# Patient Record
Sex: Female | Born: 1935 | Race: Black or African American | Hispanic: No | Smoking: Never smoker
Health system: Southern US, Community
[De-identification: ages and names within clinical notes are randomized; demographics above are authoritative.]

## PROBLEM LIST (undated history)

## (undated) DIAGNOSIS — C801 Malignant (primary) neoplasm, unspecified: Secondary | ICD-10-CM

## (undated) DIAGNOSIS — I10 Essential (primary) hypertension: Secondary | ICD-10-CM

## (undated) DIAGNOSIS — I2510 Atherosclerotic heart disease of native coronary artery without angina pectoris: Secondary | ICD-10-CM

## (undated) DIAGNOSIS — I219 Acute myocardial infarction, unspecified: Secondary | ICD-10-CM

## (undated) DIAGNOSIS — D649 Anemia, unspecified: Secondary | ICD-10-CM

## (undated) DIAGNOSIS — C50919 Malignant neoplasm of unspecified site of unspecified female breast: Secondary | ICD-10-CM

## (undated) HISTORY — PX: COLONOSCOPY: SHX174

---

## 1982-01-17 HISTORY — PX: FOOT SURGERY: SHX648

## 2003-10-28 ENCOUNTER — Ambulatory Visit: Payer: Self-pay | Admitting: Unknown Physician Specialty

## 2004-10-25 ENCOUNTER — Ambulatory Visit: Payer: Self-pay | Admitting: Internal Medicine

## 2005-10-27 ENCOUNTER — Ambulatory Visit: Payer: Self-pay | Admitting: Internal Medicine

## 2007-01-25 ENCOUNTER — Ambulatory Visit: Payer: Self-pay | Admitting: Family Medicine

## 2008-02-14 ENCOUNTER — Ambulatory Visit: Payer: Self-pay | Admitting: Family Medicine

## 2008-06-15 ENCOUNTER — Ambulatory Visit: Payer: Self-pay | Admitting: Internal Medicine

## 2008-12-18 ENCOUNTER — Ambulatory Visit: Payer: Self-pay | Admitting: Unknown Physician Specialty

## 2009-01-04 ENCOUNTER — Ambulatory Visit: Payer: Self-pay | Admitting: Internal Medicine

## 2009-01-17 DIAGNOSIS — C801 Malignant (primary) neoplasm, unspecified: Secondary | ICD-10-CM

## 2009-01-17 DIAGNOSIS — C50919 Malignant neoplasm of unspecified site of unspecified female breast: Secondary | ICD-10-CM

## 2009-01-17 HISTORY — PX: BREAST BIOPSY: SHX20

## 2009-01-17 HISTORY — PX: BREAST EXCISIONAL BIOPSY: SUR124

## 2009-01-17 HISTORY — DX: Malignant neoplasm of unspecified site of unspecified female breast: C50.919

## 2009-01-17 HISTORY — DX: Malignant (primary) neoplasm, unspecified: C80.1

## 2009-07-09 ENCOUNTER — Ambulatory Visit: Payer: Self-pay | Admitting: Unknown Physician Specialty

## 2009-07-21 ENCOUNTER — Ambulatory Visit: Payer: Self-pay | Admitting: Family Medicine

## 2009-09-14 ENCOUNTER — Ambulatory Visit: Payer: Self-pay | Admitting: Family Medicine

## 2009-09-17 ENCOUNTER — Ambulatory Visit: Payer: Self-pay | Admitting: Family Medicine

## 2009-10-02 ENCOUNTER — Ambulatory Visit: Payer: Self-pay | Admitting: Surgery

## 2009-10-08 ENCOUNTER — Ambulatory Visit: Payer: Self-pay | Admitting: Cardiovascular Disease

## 2009-10-12 ENCOUNTER — Ambulatory Visit: Payer: Self-pay | Admitting: Surgery

## 2009-10-17 ENCOUNTER — Ambulatory Visit: Payer: Self-pay | Admitting: Radiation Oncology

## 2009-10-21 ENCOUNTER — Ambulatory Visit: Payer: Self-pay | Admitting: Internal Medicine

## 2009-10-22 LAB — CANCER ANTIGEN 27.29: CA 27.29: 36.6 U/mL (ref 0.0–38.6)

## 2009-10-27 ENCOUNTER — Other Ambulatory Visit: Payer: Self-pay | Admitting: Unknown Physician Specialty

## 2009-11-12 ENCOUNTER — Ambulatory Visit: Payer: Self-pay | Admitting: Surgery

## 2009-11-17 ENCOUNTER — Ambulatory Visit: Payer: Self-pay | Admitting: Radiation Oncology

## 2009-11-23 ENCOUNTER — Ambulatory Visit: Payer: Self-pay | Admitting: Surgery

## 2009-11-27 ENCOUNTER — Ambulatory Visit: Payer: Self-pay | Admitting: Radiation Oncology

## 2009-12-08 ENCOUNTER — Ambulatory Visit: Payer: Self-pay | Admitting: Urology

## 2009-12-17 ENCOUNTER — Ambulatory Visit: Payer: Self-pay | Admitting: Radiation Oncology

## 2009-12-17 ENCOUNTER — Ambulatory Visit: Payer: Self-pay | Admitting: Internal Medicine

## 2010-01-17 ENCOUNTER — Ambulatory Visit: Payer: Self-pay | Admitting: Internal Medicine

## 2010-01-17 ENCOUNTER — Ambulatory Visit: Payer: Self-pay | Admitting: Radiation Oncology

## 2010-02-17 ENCOUNTER — Ambulatory Visit: Payer: Self-pay | Admitting: Internal Medicine

## 2010-02-17 ENCOUNTER — Ambulatory Visit: Payer: Self-pay | Admitting: Radiation Oncology

## 2010-03-18 ENCOUNTER — Ambulatory Visit: Payer: Self-pay | Admitting: Internal Medicine

## 2010-03-18 ENCOUNTER — Ambulatory Visit: Payer: Self-pay | Admitting: Radiation Oncology

## 2010-04-02 LAB — CANCER ANTIGEN 27.29: CA 27.29: 34.7 U/mL (ref 0.0–38.6)

## 2010-04-18 ENCOUNTER — Ambulatory Visit: Payer: Self-pay | Admitting: Internal Medicine

## 2010-04-18 ENCOUNTER — Ambulatory Visit: Payer: Self-pay | Admitting: Radiation Oncology

## 2010-10-04 ENCOUNTER — Ambulatory Visit: Payer: Self-pay | Admitting: Internal Medicine

## 2010-11-23 ENCOUNTER — Ambulatory Visit: Payer: Self-pay | Admitting: Urology

## 2011-05-10 ENCOUNTER — Ambulatory Visit: Payer: Self-pay | Admitting: Oncology

## 2011-05-31 ENCOUNTER — Ambulatory Visit: Payer: Self-pay | Admitting: Oncology

## 2011-05-31 LAB — COMPREHENSIVE METABOLIC PANEL WITH GFR
Albumin: 3.2 g/dL — ABNORMAL LOW (ref 3.4–5.0)
Alkaline Phosphatase: 81 U/L (ref 50–136)
Anion Gap: 7 (ref 7–16)
BUN: 14 mg/dL (ref 7–18)
Bilirubin,Total: 0.3 mg/dL (ref 0.2–1.0)
Calcium, Total: 8.7 mg/dL (ref 8.5–10.1)
Chloride: 103 mmol/L (ref 98–107)
Co2: 30 mmol/L (ref 21–32)
Creatinine: 0.92 mg/dL (ref 0.60–1.30)
EGFR (African American): >60
EGFR (Non-African Amer.): >60
Glucose: 92 mg/dL (ref 65–99)
Osmolality: 280 (ref 275–301)
Potassium: 3.8 mmol/L (ref 3.5–5.1)
SGOT(AST): 16 U/L (ref 15–37)
SGPT (ALT): 20 U/L
Sodium: 140 mmol/L (ref 136–145)
Total Protein: 8 g/dL (ref 6.4–8.2)

## 2011-05-31 LAB — CBC CANCER CENTER
Eosinophil #: 0.2 x10 3/mm (ref 0.0–0.7)
Lymphocyte %: 21 %
MCHC: 32.9 g/dL (ref 32.0–36.0)
Neutrophil %: 66.6 %
Platelet: 173 x10 3/mm (ref 150–440)
RBC: 4.24 10*6/uL (ref 3.80–5.20)

## 2011-06-18 ENCOUNTER — Ambulatory Visit: Payer: Self-pay | Admitting: Oncology

## 2011-07-18 ENCOUNTER — Ambulatory Visit: Payer: Self-pay | Admitting: Oncology

## 2011-12-08 ENCOUNTER — Ambulatory Visit: Payer: Self-pay | Admitting: Oncology

## 2011-12-18 ENCOUNTER — Ambulatory Visit: Payer: Self-pay

## 2011-12-18 LAB — COMPREHENSIVE METABOLIC PANEL
Albumin: 3.3 g/dL — ABNORMAL LOW (ref 3.4–5.0)
Anion Gap: 12 (ref 7–16)
BUN: 16 mg/dL (ref 7–18)
Bilirubin,Total: 0.6 mg/dL (ref 0.2–1.0)
Chloride: 102 mmol/L (ref 98–107)
Creatinine: 1.12 mg/dL (ref 0.60–1.30)
EGFR (African American): 55 — ABNORMAL LOW
Osmolality: 283 (ref 275–301)
Potassium: 3.1 mmol/L — ABNORMAL LOW (ref 3.5–5.1)
SGPT (ALT): 34 U/L (ref 12–78)
Sodium: 141 mmol/L (ref 136–145)
Total Protein: 8.1 g/dL (ref 6.4–8.2)

## 2011-12-18 LAB — URINALYSIS, COMPLETE
Bilirubin,UR: NEGATIVE
Glucose,UR: NEGATIVE mg/dL (ref 0–75)
Leukocyte Esterase: NEGATIVE
Ph: 6 (ref 4.5–8.0)
Protein: 100

## 2011-12-18 LAB — CBC WITH DIFFERENTIAL/PLATELET
Basophil %: 0.5 %
HCT: 38.2 % (ref 35.0–47.0)
HGB: 12.5 g/dL (ref 12.0–16.0)
Lymphocyte %: 14.3 %
Monocyte #: 0.6 x10 3/mm (ref 0.2–0.9)
Monocyte %: 8.1 %
Neutrophil #: 5.8 10*3/uL (ref 1.4–6.5)
WBC: 7.6 10*3/uL (ref 3.6–11.0)

## 2012-01-18 DIAGNOSIS — I219 Acute myocardial infarction, unspecified: Secondary | ICD-10-CM

## 2012-01-18 HISTORY — PX: CORONARY ANGIOPLASTY: SHX604

## 2012-01-18 HISTORY — DX: Acute myocardial infarction, unspecified: I21.9

## 2012-07-18 ENCOUNTER — Ambulatory Visit: Payer: Self-pay | Admitting: Oncology

## 2012-07-24 LAB — CBC CANCER CENTER
Basophil %: 0.9 %
Eosinophil #: 0.2 x10 3/mm (ref 0.0–0.7)
Eosinophil %: 3.8 %
HCT: 34.2 % — ABNORMAL LOW (ref 35.0–47.0)
Lymphocyte #: 1.2 x10 3/mm (ref 1.0–3.6)
Lymphocyte %: 23.6 %
MCH: 28.4 pg (ref 26.0–34.0)
MCV: 84 fL (ref 80–100)
Monocyte %: 12.2 %
Platelet: 159 x10 3/mm (ref 150–440)
RDW: 15.7 % — ABNORMAL HIGH (ref 11.5–14.5)

## 2012-07-24 LAB — COMPREHENSIVE METABOLIC PANEL
Albumin: 3.1 g/dL — ABNORMAL LOW (ref 3.4–5.0)
Alkaline Phosphatase: 65 U/L (ref 50–136)
Anion Gap: 5 — ABNORMAL LOW (ref 7–16)
BUN: 18 mg/dL (ref 7–18)
Bilirubin,Total: 0.4 mg/dL (ref 0.2–1.0)
Co2: 31 mmol/L (ref 21–32)
Creatinine: 0.98 mg/dL (ref 0.60–1.30)
EGFR (African American): >60
Osmolality: 285 (ref 275–301)
Potassium: 3.7 mmol/L (ref 3.5–5.1)
SGOT(AST): 15 U/L (ref 15–37)
SGPT (ALT): 18 U/L (ref 12–78)
Total Protein: 7.1 g/dL (ref 6.4–8.2)

## 2012-08-17 ENCOUNTER — Ambulatory Visit: Payer: Self-pay | Admitting: Oncology

## 2012-08-24 DIAGNOSIS — E785 Hyperlipidemia, unspecified: Secondary | ICD-10-CM | POA: Insufficient documentation

## 2012-08-24 DIAGNOSIS — I10 Essential (primary) hypertension: Secondary | ICD-10-CM | POA: Insufficient documentation

## 2012-08-30 DIAGNOSIS — I2510 Atherosclerotic heart disease of native coronary artery without angina pectoris: Secondary | ICD-10-CM | POA: Insufficient documentation

## 2012-09-19 ENCOUNTER — Ambulatory Visit: Payer: Self-pay | Admitting: Internal Medicine

## 2012-09-19 LAB — URIC ACID: Uric Acid: 6.3 mg/dL — ABNORMAL HIGH (ref 2.6–6.0)

## 2012-09-27 ENCOUNTER — Encounter: Payer: Self-pay | Admitting: Internal Medicine

## 2012-10-17 ENCOUNTER — Encounter: Payer: Self-pay | Admitting: Internal Medicine

## 2012-11-17 ENCOUNTER — Encounter: Payer: Self-pay | Admitting: Internal Medicine

## 2012-12-11 ENCOUNTER — Ambulatory Visit: Payer: Self-pay | Admitting: Oncology

## 2012-12-17 ENCOUNTER — Encounter: Payer: Self-pay | Admitting: Internal Medicine

## 2013-05-24 DIAGNOSIS — R21 Rash and other nonspecific skin eruption: Secondary | ICD-10-CM | POA: Insufficient documentation

## 2013-08-14 ENCOUNTER — Ambulatory Visit: Payer: Self-pay | Admitting: Oncology

## 2013-08-16 LAB — COMPREHENSIVE METABOLIC PANEL
ALBUMIN: 3 g/dL — AB (ref 3.4–5.0)
ALK PHOS: 60 U/L
ALT: 13 U/L — AB
ANION GAP: 7 (ref 7–16)
BILIRUBIN TOTAL: 0.3 mg/dL (ref 0.2–1.0)
BUN: 17 mg/dL (ref 7–18)
CALCIUM: 9.2 mg/dL (ref 8.5–10.1)
CHLORIDE: 104 mmol/L (ref 98–107)
CO2: 29 mmol/L (ref 21–32)
Creatinine: 1.02 mg/dL (ref 0.60–1.30)
GFR CALC NON AF AMER: 53 — AB
GLUCOSE: 100 mg/dL — AB (ref 65–99)
OSMOLALITY: 281 (ref 275–301)
POTASSIUM: 3.8 mmol/L (ref 3.5–5.1)
SGOT(AST): 14 U/L — ABNORMAL LOW (ref 15–37)
SODIUM: 140 mmol/L (ref 136–145)
Total Protein: 7.3 g/dL (ref 6.4–8.2)

## 2013-08-16 LAB — CBC CANCER CENTER
BASOS ABS: 0.1 x10 3/mm (ref 0.0–0.1)
Basophil %: 1.4 %
EOS ABS: 0.3 x10 3/mm (ref 0.0–0.7)
EOS PCT: 4.4 %
HCT: 35.3 % (ref 35.0–47.0)
HGB: 11.3 g/dL — AB (ref 12.0–16.0)
LYMPHS PCT: 14 %
Lymphocyte #: 0.9 x10 3/mm — ABNORMAL LOW (ref 1.0–3.6)
MCH: 26.1 pg (ref 26.0–34.0)
MCHC: 31.9 g/dL — AB (ref 32.0–36.0)
MCV: 82 fL (ref 80–100)
MONO ABS: 0.6 x10 3/mm (ref 0.2–0.9)
Monocyte %: 8.7 %
Neutrophil #: 4.8 x10 3/mm (ref 1.4–6.5)
Neutrophil %: 71.5 %
Platelet: 192 x10 3/mm (ref 150–440)
RBC: 4.32 10*6/uL (ref 3.80–5.20)
RDW: 15.6 % — AB (ref 11.5–14.5)
WBC: 6.7 x10 3/mm (ref 3.6–11.0)

## 2013-08-17 ENCOUNTER — Ambulatory Visit: Payer: Self-pay | Admitting: Oncology

## 2013-08-19 LAB — CANCER ANTIGEN 27.29: CA 27.29: 29.1 U/mL (ref 0.0–38.6)

## 2013-09-20 DIAGNOSIS — M109 Gout, unspecified: Secondary | ICD-10-CM | POA: Insufficient documentation

## 2013-12-16 ENCOUNTER — Ambulatory Visit: Payer: Self-pay | Admitting: Oncology

## 2014-08-13 ENCOUNTER — Telehealth: Payer: Self-pay | Admitting: *Deleted

## 2014-08-13 MED ORDER — TAMOXIFEN CITRATE 20 MG PO TABS: 20.0000 mg | ORAL_TABLET | Freq: Every day | ORAL | Status: DC

## 2014-08-13 NOTE — Telephone Encounter (Signed)
Escribed

## 2014-08-15 ENCOUNTER — Other Ambulatory Visit: Payer: Self-pay | Admitting: *Deleted

## 2014-08-15 DIAGNOSIS — C50919 Malignant neoplasm of unspecified site of unspecified female breast: Secondary | ICD-10-CM

## 2014-08-20 ENCOUNTER — Inpatient Hospital Stay: Payer: Medicare HMO | Attending: Oncology

## 2014-08-20 ENCOUNTER — Inpatient Hospital Stay: Payer: Medicare HMO | Admitting: Oncology

## 2014-08-20 DIAGNOSIS — Z853 Personal history of malignant neoplasm of breast: Secondary | ICD-10-CM | POA: Insufficient documentation

## 2014-08-20 DIAGNOSIS — Z79899 Other long term (current) drug therapy: Secondary | ICD-10-CM | POA: Insufficient documentation

## 2014-08-20 DIAGNOSIS — Z170 Estrogen receptor positive status [ER+]: Secondary | ICD-10-CM | POA: Insufficient documentation

## 2014-09-16 ENCOUNTER — Encounter: Payer: Self-pay | Admitting: Oncology

## 2014-09-16 ENCOUNTER — Inpatient Hospital Stay (HOSPITAL_BASED_OUTPATIENT_CLINIC_OR_DEPARTMENT_OTHER): Payer: Medicare HMO | Admitting: Oncology

## 2014-09-16 ENCOUNTER — Inpatient Hospital Stay: Payer: Medicare HMO

## 2014-09-16 VITALS — BP 127/80 | HR 72 | Temp 96.1°F | Wt 145.0 lb

## 2014-09-16 DIAGNOSIS — Z170 Estrogen receptor positive status [ER+]: Secondary | ICD-10-CM | POA: Diagnosis not present

## 2014-09-16 DIAGNOSIS — Z853 Personal history of malignant neoplasm of breast: Secondary | ICD-10-CM

## 2014-09-16 DIAGNOSIS — Z79899 Other long term (current) drug therapy: Secondary | ICD-10-CM

## 2014-09-16 DIAGNOSIS — C50919 Malignant neoplasm of unspecified site of unspecified female breast: Secondary | ICD-10-CM

## 2014-09-16 LAB — CBC WITH DIFFERENTIAL/PLATELET
BASOS ABS: 0.1 10*3/uL (ref 0–0.1)
Basophils Relative: 1 %
EOS ABS: 0.4 10*3/uL (ref 0–0.7)
EOS PCT: 6 %
HCT: 35.7 % (ref 35.0–47.0)
Hemoglobin: 11.4 g/dL — ABNORMAL LOW (ref 12.0–16.0)
Lymphocytes Relative: 15 %
Lymphs Abs: 1.1 10*3/uL (ref 1.0–3.6)
MCH: 24.9 pg — AB (ref 26.0–34.0)
MCHC: 32.1 g/dL (ref 32.0–36.0)
MCV: 77.7 fL — ABNORMAL LOW (ref 80.0–100.0)
MONO ABS: 0.6 10*3/uL (ref 0.2–0.9)
Monocytes Relative: 9 %
Neutro Abs: 4.8 10*3/uL (ref 1.4–6.5)
Neutrophils Relative %: 69 %
PLATELETS: 165 10*3/uL (ref 150–440)
RBC: 4.59 MIL/uL (ref 3.80–5.20)
RDW: 19.2 % — AB (ref 11.5–14.5)
WBC: 7 10*3/uL (ref 3.6–11.0)

## 2014-09-16 LAB — COMPREHENSIVE METABOLIC PANEL
ALBUMIN: 3.5 g/dL (ref 3.5–5.0)
ALK PHOS: 58 U/L (ref 38–126)
ALT: 9 U/L — AB (ref 14–54)
ANION GAP: 5 (ref 5–15)
AST: 17 U/L (ref 15–41)
BUN: 16 mg/dL (ref 6–20)
CALCIUM: 8.3 mg/dL — AB (ref 8.9–10.3)
CO2: 27 mmol/L (ref 22–32)
CREATININE: 0.94 mg/dL (ref 0.44–1.00)
Chloride: 105 mmol/L (ref 101–111)
GFR calc Af Amer: >60 mL/min (ref >60)
GFR calc non Af Amer: 56 mL/min — ABNORMAL LOW (ref >60)
GLUCOSE: 126 mg/dL — AB (ref 65–99)
Potassium: 3.3 mmol/L — ABNORMAL LOW (ref 3.5–5.1)
SODIUM: 137 mmol/L (ref 135–145)
Total Bilirubin: 0.3 mg/dL (ref 0.3–1.2)
Total Protein: 7.4 g/dL (ref 6.5–8.1)

## 2014-09-16 NOTE — Progress Notes (Signed)
Patient does have living will.  Never smoked.  Here today for f/u regarding breast cancer. Patient states she had an MI a year ago.  Doing well since then except for exertional SOB

## 2014-09-16 NOTE — Progress Notes (Signed)
[place] @ [facility] Telephone:(336) [phone]  Fax:(336) [place]: [birth year]  MR#: [id number]  GMW#:[id number]  Patient Care Team: Mendel Ryder, MD as PCP - General (Family Medicine)  CHIEF COMPLAINT:  Chief Complaint  Patient presents with  . Follow-up   1. 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 09/17/09 bilateral mammogram: In the anterior depth, immediately lateral to the level of the nipple, there is a cluster of micocalcifications. Many calcifications are round, there are a few that are pleomorphic. These are new from prior study 02/14/08. b. 10/12/09: left breast needly localization breast biopsy: Pathology showing ductal carcinoma in situ, nuclear grade 2, solid and cribiform types with micocalcifications. DCISis <0.1 cm from inked margin. Focus of atypical lobular hyperplasia, Proliferative fibrocystic change.Negative for invasive ca. 2.  Patient has finished total 5 years of tamoxifen therapy and was advised to stop tamoxifen at present time (August of 2016)   INTERVAL HISTORY:  81 year old lady came today further follow-up regarding carcinoma of breast.  Patient denies any vaginal bleeding.  No abdominal discomfort.  Patient has finished 5 years of tamoxifen was advised to stop it.  No chills fever getting regular mammogram done.  REVIEW OF SYSTEMS:    general status: Patient is feeling weak and tired.  No change in a performance status.  No chills.  No fever. HEENT:   No evidence of stomatitis Lungs: No cough or shortness of breath Cardiac: No chest pain or paroxysmal nocturnal dyspnea GI: No nausea no vomiting no diarrhea no abdominal pain Skin: No rash Lower extremity no swelling Neurological system: No tingling.  No numbness.  No other focal signs Musculoskeletal system no bony pains  As per HPI. Otherwise, a complete review of systems is negatve.  Additional Past Medical and Surgical History: past medical history:    Hypertension  hyperlipidemia  HG SIL in 2000  Gout  Colonic poly[  GERD  Seasonal allergies    Past surgical history:  Ganglion cyst excision    Social history:  denies tobacco, ETOH  Married    Family history:  Mother died of lymphoma ADVANCED DIRECTIVES:  No flowsheet data found.  HEALTH MAINTENANCE: Social History  Substance Use Topics  . Smoking status: Never Smoker   . Smokeless tobacco: Not on file  . Alcohol Use: Not on file      Allergies  Allergen Reactions  . Lisinopril Cough  . Oxycodone Itching    Increase HR per patient  . Losartan Rash    Current Outpatient Prescriptions  Medication Sig Dispense Refill  . albuterol (PROAIR HFA) 108 (90 BASE) MCG/ACT inhaler Inhale into the lungs.    Marland Kitchen amLODipine (NORVASC) 10 MG tablet Take by mouth.    Marland Kitchen aspirin EC 81 MG tablet Take by mouth.    . metoprolol tartrate (LOPRESSOR) 25 MG tablet Take by mouth.    . Naphazoline-Pheniramine 0.027-0.315 % SOLN     . pravastatin (PRAVACHOL) 80 MG tablet Take by mouth.    . tamoxifen (NOLVADEX) 20 MG tablet Take 1 tablet (20 mg total) by mouth daily. 30 tablet 4   No current facility-administered medications for this visit.    OBJECTIVE:  Filed Vitals:   09/16/14 1612  BP: 127/80  Pulse: 72  Temp: 96.1 F (35.6 C)     There is no height on file to calculate BMI.    ECOG FS:1 - Symptomatic but completely ambulatory  PHYSICAL EXAM: GENERAL:  Well developed, well nourished, sitting comfortably in the exam room in no acute distress. MENTAL STATUS:  Alert and oriented to person, place and time.  ENT:  Oropharynx clear without lesion.  Tongue normal. Mucous membranes moist.  RESPIRATORY:  Clear to auscultation without rales, wheezes or rhonchi. CARDIOVASCULAR:  Regular rate and rhythm without murmur, rub or gallop. BREAST:  Right breast without masses, skin changes or nipple discharge.  Left breast without masses, skin changes or nipple discharge. ABDOMEN:  Soft,  non-tender, with active bowel sounds, and no hepatosplenomegaly.  No masses. BACK:  No CVA tenderness.  No tenderness on percussion of the back or rib cage. SKIN:  No rashes, ulcers or lesions. EXTREMITIES: No edema, no skin discoloration or tenderness.  No palpable cords. LYMPH NODES: No palpable cervical, supraclavicular, axillary or inguinal adenopathy  NEUROLOGICAL: Unremarkable. PSYCH:  Appropriate.  LAB RESULTS:  CBC Latest Ref Rng 09/16/2014 08/16/2013  WBC 3.6 - 11.0 K/uL 7.0 6.7  Hemoglobin 12.0 - 16.0 g/dL 11.4(L) 11.3(L)  Hematocrit 35.0 - 47.0 % 35.7 35.3  Platelets 150 - 440 K/uL 165 192    Appointment on 09/16/2014  Component Date Value Ref Range Status  . WBC 09/16/2014 7.0  3.6 - 11.0 K/uL Final  . RBC 09/16/2014 4.59  3.80 - 5.20 MIL/uL Final  . Hemoglobin 09/16/2014 11.4* 12.0 - 16.0 g/dL Final  . HCT 09/16/2014 35.7  35.0 - 47.0 % Final  . MCV 09/16/2014 77.7* 80.0 - 100.0 fL Final  . MCH 09/16/2014 24.9* 26.0 - 34.0 pg Final  . MCHC 09/16/2014 32.1  32.0 - 36.0 g/dL Final  . RDW 09/16/2014 19.2* 11.5 - 14.5 % Final  . Platelets 09/16/2014 165  150 - 440 K/uL Final  . Neutrophils Relative % 09/16/2014 69   Final  . Neutro Abs 09/16/2014 4.8  1.4 - 6.5 K/uL Final  . Lymphocytes Relative 09/16/2014 15   Final  . Lymphs Abs 09/16/2014 1.1  1.0 - 3.6 K/uL Final  . Monocytes Relative 09/16/2014 9   Final  . Monocytes Absolute 09/16/2014 0.6  0.2 - 0.9 K/uL Final  . Eosinophils Relative 09/16/2014 6   Final  . Eosinophils Absolute 09/16/2014 0.4  0 - 0.7 K/uL Final  . Basophils Relative 09/16/2014 1   Final  . Basophils Absolute 09/16/2014 0.1  0 - 0.1 K/uL Final  . Sodium 09/16/2014 137  135 - 145 mmol/L Final  . Potassium 09/16/2014 3.3* 3.5 - 5.1 mmol/L Final  . Chloride 09/16/2014 105  101 - 111 mmol/L Final  . CO2 09/16/2014 27  22 - 32 mmol/L Final  . Glucose, Bld 09/16/2014 126* 65 - 99 mg/dL Final  . BUN 09/16/2014 16  6 - 20 mg/dL Final  . Creatinine,  Ser 09/16/2014 0.94  0.44 - 1.00 mg/dL Final  . Calcium 09/16/2014 8.3* 8.9 - 10.3 mg/dL Final  . Total Protein 09/16/2014 7.4  6.5 - 8.1 g/dL Final  . Albumin 09/16/2014 3.5  3.5 - 5.0 g/dL Final  . AST 09/16/2014 17  15 - 41 U/L Final  . ALT 09/16/2014 9* 14 - 54 U/L Final  . Alkaline Phosphatase 09/16/2014 58  38 - 126 U/L Final  . Total Bilirubin 09/16/2014 0.3  0.3 - 1.2 mg/dL Final  . GFR calc non Af Amer 09/16/2014 56* >60 mL/min Final  . GFR calc Af Amer 09/16/2014 >60  >60 mL/min Final   Comment: (NOTE) The eGFR has been calculated using the CKD EPI equation. This calculation has  not been validated in all clinical situations. eGFR's persistently <60 mL/min signify possible Chronic Kidney Disease.   . Anion gap 09/16/2014 5  5 - 15 Final        ASSESSMENT: Recent has finished total 5 years of tamoxifen and was advised to stop tamoxifen Repeat mammogram in November of 2016 Reevaluation in 1 year  MEDICAL DECISION MAKING:  All lab data has been reviewed. Mammogram will be reviewed in November. Patient will discontinue tamoxifen Patient expressed understanding and was in agreement with this plan. She also understands that She can call clinic at any time with any questions, concerns, or complaints.    No matching staging information was found for the patient.  Belinda Gleason, MD   09/16/2014 4:29 PM

## 2014-09-17 ENCOUNTER — Encounter: Payer: Self-pay | Admitting: Oncology

## 2014-12-18 ENCOUNTER — Ambulatory Visit: Payer: Medicare HMO | Attending: Oncology

## 2014-12-18 ENCOUNTER — Other Ambulatory Visit: Payer: Medicare HMO

## 2015-01-15 ENCOUNTER — Ambulatory Visit
Admission: RE | Admit: 2015-01-15 | Discharge: 2015-01-15 | Disposition: A | Discharge status: Home / Self Care | Payer: Medicare HMO | Source: Ambulatory Visit | Attending: Oncology | Admitting: Oncology

## 2015-01-15 DIAGNOSIS — Z853 Personal history of malignant neoplasm of breast: Secondary | ICD-10-CM | POA: Insufficient documentation

## 2015-01-15 DIAGNOSIS — C50919 Malignant neoplasm of unspecified site of unspecified female breast: Secondary | ICD-10-CM

## 2015-01-15 HISTORY — DX: Malignant (primary) neoplasm, unspecified: C80.1

## 2015-01-15 HISTORY — DX: Malignant neoplasm of unspecified site of unspecified female breast: C50.919

## 2015-09-15 DIAGNOSIS — D0512 Intraductal carcinoma in situ of left breast: Secondary | ICD-10-CM | POA: Insufficient documentation

## 2015-09-15 NOTE — Progress Notes (Signed)
[place]  Telephone:(336) [phone] Fax:(336) [phone]  ID: Belinda Walsh OB: 1932/12/06  MR#: [id number]  [id number]  Patient Care Team: Mendel Ryder, MD as PCP - General (Family Medicine)  CHIEF COMPLAINT: Left breast DCIS  INTERVAL HISTORY: Patient returns to clinic today for routine yearly follow-up of her DCIS. She currently feels well and is asymptomatic. She completed 5 years of adjuvant tamoxifen in August 2016. She has no neurologic complaints. She denies any recent fevers or illnesses. She has a good appetite and denies weight loss. She has no chest pain or shortness of breath. She denies any nausea, vomiting, constipation, or diarrhea. She has no urinary complaints. Patient feels at her baseline and offers no specific complaints today.  REVIEW OF SYSTEMS:   Review of Systems  Constitutional: Negative.  Negative for fever and malaise/fatigue.  Respiratory: Negative.  Negative for cough and shortness of breath.   Cardiovascular: Negative.  Negative for chest pain.  Gastrointestinal: Negative.  Negative for abdominal pain.  Musculoskeletal: Negative.   Neurological: Negative.  Negative for weakness.  Psychiatric/Behavioral: Negative.  The patient is not nervous/anxious.     As per HPI. Otherwise, a complete review of systems is negative.  PAST MEDICAL HISTORY: Past Medical History:  Diagnosis Date  . Breast cancer ([place]) 2011   radiation  . Cancer [facility]) 2011   Left breast    PAST SURGICAL HISTORY: Past Surgical History:  Procedure Laterality Date  . BREAST BIOPSY Left 2011   core bx- positive  . BREAST EXCISIONAL BIOPSY Left 2011    FAMILY HISTORY: Family History  Problem Relation Age of Onset  . Breast cancer Cousin        ADVANCED DIRECTIVES (Y/N):  N   HEALTH MAINTENANCE: Social History  Substance Use Topics  . Smoking status: Never Smoker  . Smokeless tobacco: Not on file  . Alcohol use Not on file      Colonoscopy:  PAP:  Bone density:  Lipid panel:  Allergies  Allergen Reactions  . Lisinopril Cough  . Oxycodone Itching    Increase HR per patient  . Losartan Rash    Current Outpatient Prescriptions  Medication Sig Dispense Refill  . amLODipine (NORVASC) 10 MG tablet Take 10 mg by mouth daily.     Marland Kitchen aspirin EC 81 MG tablet Take by mouth.    . metoprolol tartrate (LOPRESSOR) 25 MG tablet Take 25 mg by mouth 2 (two) times daily.     . pravastatin (PRAVACHOL) 80 MG tablet Take 80 mg by mouth daily.      No current facility-administered medications for this visit.     OBJECTIVE: Vitals:   09/16/15 1513  BP: (!) 148/74  Pulse: (!) 59  Resp: 18  Temp: (!) 96.9 F (36.1 C)     There is no height or weight on file to calculate BMI.    ECOG FS:0 - Asymptomatic  General: Well-developed, well-nourished, no acute distress. Eyes: Pink conjunctiva, anicteric sclera. Breasts:  Bilateral breast and axilla without lumps or masses. Lungs: Clear to auscultation bilaterally. Heart: Regular rate and rhythm. No rubs, murmurs, or gallops. Abdomen: Soft, nontender, nondistended. No organomegaly noted, normoactive bowel sounds. Musculoskeletal: No edema, cyanosis, or clubbing. Neuro: Alert, answering all questions appropriately. Cranial nerves grossly intact. Skin: No rashes or petechiae noted. Psych: Normal affect.   LAB RESULTS:  Lab Results  Component Value Date   NA 138 09/16/2015   K 4.1 09/16/2015   CL 105 09/16/2015   CO2  27 09/16/2015   GLUCOSE 91 09/16/2015   BUN 16 09/16/2015   CREATININE 0.78 09/16/2015   CALCIUM 9.3 09/16/2015   PROT 7.8 09/16/2015   ALBUMIN 3.9 09/16/2015   AST 16 09/16/2015   ALT 10 (L) 09/16/2015   ALKPHOS 81 09/16/2015   BILITOT 0.5 09/16/2015   GFRNONAA >60 09/16/2015   GFRAA >60 09/16/2015    Lab Results  Component Value Date   WBC 7.4 09/16/2015   NEUTROABS 4.5 09/16/2015   HGB 10.7 (L) 09/16/2015   HCT 33.7 (L) 09/16/2015    MCV 74.1 (L) 09/16/2015   PLT 180 09/16/2015     STUDIES: No results found.  ASSESSMENT: Left breast DCIS  PLAN:    1. Left breast DCIS:Patient completed 5 years of tamoxifen in August 2016. Her most recent mammogram in December 2016 was reported as BI-RADS 2. Will repeat this in December 2017. After lengthy discussion with the patient, no further follow-up is necessary. Patient has been instructed to ensure that her primary care continue to order and follow-up on her yearly mammograms. 2. Anemia: Mild, monitor. 3. Hypertension: Continued monitoring and treatment per primary care.  Patient expressed understanding and was in agreement with this plan. She also understands that She can call clinic at any time with any questions, concerns, or complaints.   Ductal carcinoma in situ (DCIS) of left breast   Staging form: Breast, AJCC 7th Edition   - Clinical stage from 09/15/2015: Stage 0 (Tis (DCIS), N0, M0) - Signed by Lloyd Huger, MD on 09/15/2015  Lloyd Huger, MD   09/21/2015 9:30 AM

## 2015-09-16 ENCOUNTER — Inpatient Hospital Stay: Payer: Medicare HMO

## 2015-09-16 ENCOUNTER — Other Ambulatory Visit: Payer: Medicare HMO

## 2015-09-16 ENCOUNTER — Inpatient Hospital Stay: Payer: Medicare HMO | Attending: Oncology | Admitting: Oncology

## 2015-09-16 ENCOUNTER — Ambulatory Visit: Payer: Medicare HMO | Admitting: Oncology

## 2015-09-16 DIAGNOSIS — Z853 Personal history of malignant neoplasm of breast: Secondary | ICD-10-CM | POA: Diagnosis not present

## 2015-09-16 DIAGNOSIS — Z170 Estrogen receptor positive status [ER+]: Secondary | ICD-10-CM | POA: Diagnosis not present

## 2015-09-16 DIAGNOSIS — I10 Essential (primary) hypertension: Secondary | ICD-10-CM | POA: Diagnosis not present

## 2015-09-16 DIAGNOSIS — Z9223 Personal history of estrogen therapy: Secondary | ICD-10-CM | POA: Diagnosis not present

## 2015-09-16 DIAGNOSIS — D649 Anemia, unspecified: Secondary | ICD-10-CM

## 2015-09-16 DIAGNOSIS — Z923 Personal history of irradiation: Secondary | ICD-10-CM | POA: Diagnosis not present

## 2015-09-16 DIAGNOSIS — Z79899 Other long term (current) drug therapy: Secondary | ICD-10-CM | POA: Insufficient documentation

## 2015-09-16 DIAGNOSIS — C50919 Malignant neoplasm of unspecified site of unspecified female breast: Secondary | ICD-10-CM

## 2015-09-16 DIAGNOSIS — D0512 Intraductal carcinoma in situ of left breast: Secondary | ICD-10-CM

## 2015-09-16 LAB — CBC WITH DIFFERENTIAL/PLATELET
BASOS PCT: 2 %
Basophils Absolute: 0.1 10*3/uL (ref 0–0.1)
EOS ABS: 0.4 10*3/uL (ref 0–0.7)
EOS PCT: 6 %
HCT: 33.7 % — ABNORMAL LOW (ref 35.0–47.0)
Hemoglobin: 10.7 g/dL — ABNORMAL LOW (ref 12.0–16.0)
LYMPHS ABS: 1.5 10*3/uL (ref 1.0–3.6)
Lymphocytes Relative: 20 %
MCH: 23.6 pg — AB (ref 26.0–34.0)
MCHC: 31.9 g/dL — AB (ref 32.0–36.0)
MCV: 74.1 fL — ABNORMAL LOW (ref 80.0–100.0)
Monocytes Absolute: 0.8 10*3/uL (ref 0.2–0.9)
Monocytes Relative: 11 %
Neutro Abs: 4.5 10*3/uL (ref 1.4–6.5)
Neutrophils Relative %: 61 %
Platelets: 180 10*3/uL (ref 150–440)
RBC: 4.55 MIL/uL (ref 3.80–5.20)
RDW: 18.2 % — ABNORMAL HIGH (ref 11.5–14.5)
WBC: 7.4 10*3/uL (ref 3.6–11.0)

## 2015-09-16 LAB — COMPREHENSIVE METABOLIC PANEL
ALT: 10 U/L — ABNORMAL LOW (ref 14–54)
ANION GAP: 6 (ref 5–15)
AST: 16 U/L (ref 15–41)
Albumin: 3.9 g/dL (ref 3.5–5.0)
Alkaline Phosphatase: 81 U/L (ref 38–126)
BUN: 16 mg/dL (ref 6–20)
CHLORIDE: 105 mmol/L (ref 101–111)
CO2: 27 mmol/L (ref 22–32)
Calcium: 9.3 mg/dL (ref 8.9–10.3)
Creatinine, Ser: 0.78 mg/dL (ref 0.44–1.00)
GFR calc non Af Amer: >60 mL/min (ref >60)
Glucose, Bld: 91 mg/dL (ref 65–99)
Potassium: 4.1 mmol/L (ref 3.5–5.1)
SODIUM: 138 mmol/L (ref 135–145)
Total Bilirubin: 0.5 mg/dL (ref 0.3–1.2)
Total Protein: 7.8 g/dL (ref 6.5–8.1)

## 2015-09-16 NOTE — Progress Notes (Signed)
States is feeling well. Offers no complaints. 

## 2016-01-20 ENCOUNTER — Ambulatory Visit
Admission: RE | Admit: 2016-01-20 | Discharge: 2016-01-20 | Disposition: A | Discharge status: Home / Self Care | Payer: Medicare HMO | Source: Ambulatory Visit | Attending: Oncology | Admitting: Oncology

## 2016-01-20 DIAGNOSIS — D0512 Intraductal carcinoma in situ of left breast: Secondary | ICD-10-CM | POA: Diagnosis present

## 2016-01-20 DIAGNOSIS — Z853 Personal history of malignant neoplasm of breast: Secondary | ICD-10-CM | POA: Insufficient documentation

## 2016-09-05 ENCOUNTER — Other Ambulatory Visit
Admission: RE | Admit: 2016-09-05 | Discharge: 2016-09-05 | Disposition: A | Discharge status: Home / Self Care | Payer: Medicare HMO | Source: Ambulatory Visit | Attending: Unknown Physician Specialty | Admitting: Unknown Physician Specialty

## 2016-09-05 DIAGNOSIS — M79642 Pain in left hand: Secondary | ICD-10-CM | POA: Insufficient documentation

## 2016-09-09 LAB — BODY FLUID CULTURE: Culture: NO GROWTH

## 2016-10-20 ENCOUNTER — Other Ambulatory Visit: Payer: Self-pay | Admitting: Internal Medicine

## 2016-10-20 DIAGNOSIS — M25572 Pain in left ankle and joints of left foot: Principal | ICD-10-CM

## 2016-10-20 DIAGNOSIS — G8929 Other chronic pain: Secondary | ICD-10-CM

## 2016-10-27 ENCOUNTER — Ambulatory Visit: Payer: Medicare HMO

## 2016-11-14 ENCOUNTER — Ambulatory Visit
Admission: RE | Admit: 2016-11-14 | Discharge: 2016-11-14 | Disposition: A | Discharge status: Home / Self Care | Payer: Medicare HMO | Source: Ambulatory Visit | Attending: Internal Medicine | Admitting: Internal Medicine

## 2016-11-14 DIAGNOSIS — M659 Synovitis and tenosynovitis, unspecified: Secondary | ICD-10-CM | POA: Diagnosis not present

## 2016-11-14 DIAGNOSIS — G8929 Other chronic pain: Secondary | ICD-10-CM | POA: Diagnosis present

## 2016-11-14 DIAGNOSIS — M25472 Effusion, left ankle: Secondary | ICD-10-CM | POA: Insufficient documentation

## 2016-11-14 DIAGNOSIS — M25572 Pain in left ankle and joints of left foot: Secondary | ICD-10-CM | POA: Diagnosis present

## 2017-02-27 ENCOUNTER — Other Ambulatory Visit: Payer: Self-pay | Admitting: Internal Medicine

## 2017-02-27 DIAGNOSIS — Z1231 Encounter for screening mammogram for malignant neoplasm of breast: Secondary | ICD-10-CM

## 2017-03-03 ENCOUNTER — Ambulatory Visit
Admission: RE | Admit: 2017-03-03 | Discharge: 2017-03-03 | Disposition: A | Discharge status: Home / Self Care | Payer: Medicare HMO | Source: Ambulatory Visit | Attending: Internal Medicine | Admitting: Internal Medicine

## 2017-03-03 DIAGNOSIS — Z1231 Encounter for screening mammogram for malignant neoplasm of breast: Secondary | ICD-10-CM | POA: Insufficient documentation

## 2017-04-06 NOTE — Discharge Instructions (Signed)

## 2017-04-11 ENCOUNTER — Encounter
Admission: RE | Disposition: A | Discharge status: Home / Self Care | Payer: Self-pay | Source: Ambulatory Visit | Attending: Ophthalmology

## 2017-04-11 ENCOUNTER — Ambulatory Visit: Payer: Medicare HMO | Admitting: Anesthesiology

## 2017-04-11 ENCOUNTER — Ambulatory Visit
Admission: RE | Admit: 2017-04-11 | Discharge: 2017-04-11 | Disposition: A | Discharge status: Home / Self Care | Payer: Medicare HMO | Source: Ambulatory Visit | Attending: Ophthalmology | Admitting: Ophthalmology

## 2017-04-11 DIAGNOSIS — J45909 Unspecified asthma, uncomplicated: Secondary | ICD-10-CM | POA: Insufficient documentation

## 2017-04-11 DIAGNOSIS — Z79899 Other long term (current) drug therapy: Secondary | ICD-10-CM | POA: Insufficient documentation

## 2017-04-11 DIAGNOSIS — I10 Essential (primary) hypertension: Secondary | ICD-10-CM | POA: Insufficient documentation

## 2017-04-11 DIAGNOSIS — H2512 Age-related nuclear cataract, left eye: Secondary | ICD-10-CM | POA: Diagnosis not present

## 2017-04-11 DIAGNOSIS — Z7982 Long term (current) use of aspirin: Secondary | ICD-10-CM | POA: Insufficient documentation

## 2017-04-11 DIAGNOSIS — I2510 Atherosclerotic heart disease of native coronary artery without angina pectoris: Secondary | ICD-10-CM | POA: Insufficient documentation

## 2017-04-11 DIAGNOSIS — Z853 Personal history of malignant neoplasm of breast: Secondary | ICD-10-CM | POA: Insufficient documentation

## 2017-04-11 DIAGNOSIS — Z955 Presence of coronary angioplasty implant and graft: Secondary | ICD-10-CM | POA: Diagnosis not present

## 2017-04-11 DIAGNOSIS — I252 Old myocardial infarction: Secondary | ICD-10-CM | POA: Diagnosis not present

## 2017-04-11 HISTORY — DX: Essential (primary) hypertension: I10

## 2017-04-11 HISTORY — DX: Anemia, unspecified: D64.9

## 2017-04-11 HISTORY — DX: Acute myocardial infarction, unspecified: I21.9

## 2017-04-11 HISTORY — PX: CATARACT EXTRACTION W/PHACO: SHX586

## 2017-04-11 HISTORY — DX: Atherosclerotic heart disease of native coronary artery without angina pectoris: I25.10

## 2017-04-11 SURGERY — PHACOEMULSIFICATION, CATARACT, WITH IOL INSERTION
Anesthesia: Monitor Anesthesia Care | Site: Eye | Laterality: Left | Wound class: Clean

## 2017-04-11 MED ORDER — ACETAMINOPHEN 160 MG/5ML PO SOLN: 325.0000 mg | ORAL | Status: DC | PRN

## 2017-04-11 MED ORDER — SODIUM HYALURONATE 10 MG/ML IO SOLN
INTRAOCULAR | Status: DC | PRN
  Administered 2017-04-11: 0.55 mL via INTRAOCULAR

## 2017-04-11 MED ORDER — ACETAMINOPHEN 325 MG PO TABS: 650.0000 mg | ORAL_TABLET | Freq: Once | ORAL | Status: DC | PRN

## 2017-04-11 MED ORDER — ONDANSETRON HCL 4 MG/2ML IJ SOLN: 4.0000 mg | Freq: Once | INTRAMUSCULAR | Status: DC | PRN

## 2017-04-11 MED ORDER — LIDOCAINE HCL (PF) 2 % IJ SOLN
INTRAOCULAR | Status: DC | PRN
  Administered 2017-04-11: 1 mL via INTRAOCULAR

## 2017-04-11 MED ORDER — SODIUM HYALURONATE 23 MG/ML IO SOLN
INTRAOCULAR | Status: DC | PRN
  Administered 2017-04-11: 0.6 mL via INTRAOCULAR

## 2017-04-11 MED ORDER — MIDAZOLAM HCL 2 MG/2ML IJ SOLN
INTRAMUSCULAR | Status: DC | PRN
  Administered 2017-04-11: 2 mg via INTRAVENOUS

## 2017-04-11 MED ORDER — MOXIFLOXACIN HCL 0.5 % OP SOLN
OPHTHALMIC | Status: DC | PRN
  Administered 2017-04-11: 0.2 mL via OPHTHALMIC

## 2017-04-11 MED ORDER — LACTATED RINGERS IV SOLN: INTRAVENOUS | Status: DC

## 2017-04-11 MED ORDER — ARMC OPHTHALMIC DILATING DROPS
1.0000 "application " | OPHTHALMIC | Status: DC | PRN
  Administered 2017-04-11 (×3): 1 via OPHTHALMIC

## 2017-04-11 MED ORDER — EPINEPHRINE PF 1 MG/ML IJ SOLN
INTRAMUSCULAR | Status: DC | PRN
  Administered 2017-04-11: 84 mL via OPHTHALMIC

## 2017-04-11 MED ORDER — FENTANYL CITRATE (PF) 100 MCG/2ML IJ SOLN
INTRAMUSCULAR | Status: DC | PRN
  Administered 2017-04-11: 50 ug via INTRAVENOUS

## 2017-04-11 SURGICAL SUPPLY — 16 items
CANNULA ANT/CHMB 27GA (MISCELLANEOUS) ×3 IMPLANT
DISSECTOR HYDRO NUCLEUS 50X22 (MISCELLANEOUS) ×3 IMPLANT
GLOVE BIO SURGEON STRL SZ8 (GLOVE) ×3 IMPLANT
GLOVE SURG LX 7.5 STRW (GLOVE) ×2
GLOVE SURG LX STRL 7.5 STRW (GLOVE) ×1 IMPLANT
GOWN STRL REUS W/ TWL LRG LVL3 (GOWN DISPOSABLE) ×2 IMPLANT
GOWN STRL REUS W/TWL LRG LVL3 (GOWN DISPOSABLE) ×4
LENS IOL TECNIS ITEC 20.0 (Intraocular Lens) ×3 IMPLANT
MARKER SKIN DUAL TIP RULER LAB (MISCELLANEOUS) ×3 IMPLANT
PACK CATARACT (MISCELLANEOUS) ×3 IMPLANT
PACK DR. KING ARMS (PACKS) ×3 IMPLANT
PACK EYE AFTER SURG (MISCELLANEOUS) ×3 IMPLANT
SYR 3ML LL SCALE MARK (SYRINGE) ×3 IMPLANT
SYR TB 1ML LUER SLIP (SYRINGE) ×3 IMPLANT
WATER STERILE IRR 500ML POUR (IV SOLUTION) ×3 IMPLANT
WIPE NON LINTING 3.25X3.25 (MISCELLANEOUS) ×3 IMPLANT

## 2017-04-11 NOTE — H&P (Signed)
The History and Physical notes are on paper, have been signed, and are to be scanned.   I have examined the patient and there are no changes to the H&P.   Benay Pillow 04/11/2017 10:58 AM

## 2017-04-11 NOTE — Anesthesia Preprocedure Evaluation (Signed)
Anesthesia Evaluation  Patient identified by MRN, date of birth, ID band Patient awake    Reviewed: Allergy & Precautions, NPO status , Patient's Chart, lab work & pertinent test results  History of Anesthesia Complications Negative for: history of anesthetic complications  Airway Mallampati: I  TM Distance: >3 FB Neck ROM: Full    Dental  (+) Edentulous Upper, Edentulous Lower, Lower Dentures, Upper Dentures   Pulmonary asthma ,    Pulmonary exam normal breath sounds clear to auscultation       Cardiovascular Exercise Tolerance: Good hypertension, + CAD, + Past MI (2014) and + Cardiac Stents  Normal cardiovascular exam Rhythm:Regular Rate:Normal     Neuro/Psych negative neurological ROS     GI/Hepatic negative GI ROS,   Endo/Other  negative endocrine ROS  Renal/GU negative Renal ROS     Musculoskeletal   Abdominal   Peds  Hematology  (+) Blood dyscrasia, anemia , Breast CA   Anesthesia Other Findings   Reproductive/Obstetrics                             Anesthesia Physical Anesthesia Plan  ASA: III  Anesthesia Plan: MAC   Post-op Pain Management:    Induction: Intravenous  PONV Risk Score and Plan: 2 and TIVA and Midazolam  Airway Management Planned: Natural Airway  Additional Equipment:   Intra-op Plan:   Post-operative Plan:   Informed Consent: I have reviewed the patients History and Physical, chart, labs and discussed the procedure including the risks, benefits and alternatives for the proposed anesthesia with the patient or authorized representative who has indicated his/her understanding and acceptance.     Plan Discussed with: CRNA  Anesthesia Plan Comments:         Anesthesia Quick Evaluation

## 2017-04-11 NOTE — Anesthesia Postprocedure Evaluation (Signed)
Anesthesia Post Note  Patient: Belinda Walsh  Procedure(s) Performed: CATARACT EXTRACTION PHACO AND INTRAOCULAR LENS PLACEMENT (IOC) LEFT (Left Eye)  Patient location during evaluation: PACU Anesthesia Type: MAC Level of consciousness: awake and alert, oriented and patient cooperative Pain management: pain level controlled Vital Signs Assessment: post-procedure vital signs reviewed and stable Respiratory status: spontaneous breathing, nonlabored ventilation and respiratory function stable Cardiovascular status: blood pressure returned to baseline and stable Postop Assessment: adequate PO intake Anesthetic complications: no    Darrin Nipper

## 2017-04-11 NOTE — Transfer of Care (Signed)
Immediate Anesthesia Transfer of Care Note  Patient: Belinda Walsh  Procedure(s) Performed: CATARACT EXTRACTION PHACO AND INTRAOCULAR LENS PLACEMENT (IOC) LEFT (Left Eye)  Patient Location: PACU  Anesthesia Type: MAC  Level of Consciousness: awake, alert  and patient cooperative  Airway and Oxygen Therapy: Patient Spontanous Breathing and Patient connected to supplemental oxygen  Post-op Assessment: Post-op Vital signs reviewed, Patient's Cardiovascular Status Stable, Respiratory Function Stable, Patent Airway and No signs of Nausea or vomiting  Post-op Vital Signs: Reviewed and stable  Complications: No apparent anesthesia complications

## 2017-04-11 NOTE — Op Note (Signed)
OPERATIVE NOTE  JERA HEADINGS [id number] 04/11/2017   PREOPERATIVE DIAGNOSIS:  Nuclear sclerotic cataract left eye.  H25.12   POSTOPERATIVE DIAGNOSIS:    Nuclear sclerotic cataract left eye.     PROCEDURE:  Phacoemusification with posterior chamber intraocular lens placement of the left eye   LENS:   Implant Name Type Inv. Item Serial No. Manufacturer Lot No. LRB No. Used  LENS IOL DIOP 20.0 - [id number] Intraocular Lens LENS IOL DIOP 20.0 [id number]  Left 1       PCB00 +20.0   ULTRASOUND TIME: 0 minutes 38.2 seconds.  CDE 7.52   SURGEON:  Benay Pillow, MD, MPH   ANESTHESIA:  Topical with tetracaine drops augmented with 1% preservative-free intracameral lidocaine.  ESTIMATED BLOOD LOSS: <1 mL   COMPLICATIONS:  None.   DESCRIPTION OF PROCEDURE:  The patient was identified in the holding room and transported to the operating room and placed in the supine position under the operating microscope.  The left eye was identified as the operative eye and it was prepped and draped in the usual sterile ophthalmic fashion.   A 1.0 millimeter clear-corneal paracentesis was made at the 5:00 position. 0.5 ml of preservative-free 1% lidocaine with epinephrine was injected into the anterior chamber.  The anterior chamber was filled with Healon 5 viscoelastic.  A 2.4 millimeter keratome was used to make a near-clear corneal incision at the 2:00 position.  A curvilinear capsulorrhexis was made with a cystotome and capsulorrhexis forceps.  Balanced salt solution was used to hydrodissect and hydrodelineate the nucleus.   Phacoemulsification was then used in stop and chop fashion to remove the lens nucleus and epinucleus.  The remaining cortex was then removed using the irrigation and aspiration handpiece. Healon was then placed into the capsular bag to distend it for lens placement.  A lens was then injected into the capsular bag.  The remaining viscoelastic was aspirated.   Wounds were  hydrated with balanced salt solution.  The anterior chamber was inflated to a physiologic pressure with balanced salt solution.  Intracameral vigamox 0.1 mL undiltued was injected into the eye and a drop placed onto the ocular surface.  No wound leaks were noted.  The patient was taken to the recovery room in stable condition without complications of anesthesia or surgery  Benay Pillow 04/11/2017, 11:28 AM

## 2017-04-11 NOTE — Anesthesia Procedure Notes (Signed)
Procedure Name: MAC Performed by: Tahj Lindseth, CRNA Pre-anesthesia Checklist: Patient identified, Emergency Drugs available, Suction available, Timeout performed and Patient being monitored Patient Re-evaluated:Patient Re-evaluated prior to induction Oxygen Delivery Method: Nasal cannula Placement Confirmation: positive ETCO2       

## 2017-04-12 ENCOUNTER — Encounter: Payer: Self-pay | Admitting: Ophthalmology

## 2017-05-08 NOTE — Discharge Instructions (Signed)

## 2017-05-09 ENCOUNTER — Encounter
Admission: RE | Disposition: A | Discharge status: Home / Self Care | Payer: Self-pay | Source: Ambulatory Visit | Attending: Ophthalmology

## 2017-05-09 ENCOUNTER — Ambulatory Visit: Payer: Medicare HMO | Admitting: Anesthesiology

## 2017-05-09 ENCOUNTER — Ambulatory Visit
Admission: RE | Admit: 2017-05-09 | Discharge: 2017-05-09 | Disposition: A | Discharge status: Home / Self Care | Payer: Medicare HMO | Source: Ambulatory Visit | Attending: Ophthalmology | Admitting: Ophthalmology

## 2017-05-09 DIAGNOSIS — Z79899 Other long term (current) drug therapy: Secondary | ICD-10-CM | POA: Insufficient documentation

## 2017-05-09 DIAGNOSIS — E7800 Pure hypercholesterolemia, unspecified: Secondary | ICD-10-CM | POA: Insufficient documentation

## 2017-05-09 DIAGNOSIS — I252 Old myocardial infarction: Secondary | ICD-10-CM | POA: Diagnosis not present

## 2017-05-09 DIAGNOSIS — Z853 Personal history of malignant neoplasm of breast: Secondary | ICD-10-CM | POA: Insufficient documentation

## 2017-05-09 DIAGNOSIS — H2511 Age-related nuclear cataract, right eye: Secondary | ICD-10-CM | POA: Insufficient documentation

## 2017-05-09 DIAGNOSIS — J45909 Unspecified asthma, uncomplicated: Secondary | ICD-10-CM | POA: Insufficient documentation

## 2017-05-09 DIAGNOSIS — D649 Anemia, unspecified: Secondary | ICD-10-CM | POA: Diagnosis not present

## 2017-05-09 DIAGNOSIS — Z955 Presence of coronary angioplasty implant and graft: Secondary | ICD-10-CM | POA: Diagnosis not present

## 2017-05-09 DIAGNOSIS — I10 Essential (primary) hypertension: Secondary | ICD-10-CM | POA: Diagnosis not present

## 2017-05-09 HISTORY — PX: CATARACT EXTRACTION W/PHACO: SHX586

## 2017-05-09 SURGERY — PHACOEMULSIFICATION, CATARACT, WITH IOL INSERTION
Anesthesia: Monitor Anesthesia Care | Site: Eye | Laterality: Right | Wound class: Clean

## 2017-05-09 MED ORDER — ARMC OPHTHALMIC DILATING DROPS
1.0000 "application " | OPHTHALMIC | Status: DC | PRN
  Administered 2017-05-09 (×3): 1 via OPHTHALMIC

## 2017-05-09 MED ORDER — LACTATED RINGERS IV SOLN: 10.0000 mL/h | INTRAVENOUS | Status: DC

## 2017-05-09 MED ORDER — SODIUM HYALURONATE 10 MG/ML IO SOLN
INTRAOCULAR | Status: DC | PRN
  Administered 2017-05-09: 0.55 mL via INTRAOCULAR

## 2017-05-09 MED ORDER — MOXIFLOXACIN HCL 0.5 % OP SOLN
OPHTHALMIC | Status: DC | PRN
  Administered 2017-05-09: 0.2 mL via OPHTHALMIC

## 2017-05-09 MED ORDER — BSS IO SOLN
INTRAOCULAR | Status: DC | PRN
  Administered 2017-05-09: 82 mL via OPHTHALMIC

## 2017-05-09 MED ORDER — LIDOCAINE HCL (PF) 2 % IJ SOLN
INTRAOCULAR | Status: DC | PRN
  Administered 2017-05-09: 1 mL via INTRAOCULAR

## 2017-05-09 MED ORDER — SODIUM HYALURONATE 23 MG/ML IO SOLN
INTRAOCULAR | Status: DC | PRN
  Administered 2017-05-09: 0.6 mL via INTRAOCULAR

## 2017-05-09 MED ORDER — MIDAZOLAM HCL 2 MG/2ML IJ SOLN
INTRAMUSCULAR | Status: DC | PRN
  Administered 2017-05-09 (×2): 1 mg via INTRAVENOUS

## 2017-05-09 SURGICAL SUPPLY — 16 items
CANNULA ANT/CHMB 27GA (MISCELLANEOUS) ×3 IMPLANT
DISSECTOR HYDRO NUCLEUS 50X22 (MISCELLANEOUS) ×3 IMPLANT
GLOVE BIO SURGEON STRL SZ8 (GLOVE) ×3 IMPLANT
GLOVE SURG LX 7.5 STRW (GLOVE) ×2
GLOVE SURG LX STRL 7.5 STRW (GLOVE) ×1 IMPLANT
GOWN STRL REUS W/ TWL LRG LVL3 (GOWN DISPOSABLE) ×2 IMPLANT
GOWN STRL REUS W/TWL LRG LVL3 (GOWN DISPOSABLE) ×4
LENS IOL TECNIS ITEC 20.0 (Intraocular Lens) ×3 IMPLANT
MARKER SKIN DUAL TIP RULER LAB (MISCELLANEOUS) ×3 IMPLANT
PACK CATARACT (MISCELLANEOUS) ×3 IMPLANT
PACK DR. KING ARMS (PACKS) ×3 IMPLANT
PACK EYE AFTER SURG (MISCELLANEOUS) ×3 IMPLANT
SYR 3ML LL SCALE MARK (SYRINGE) ×3 IMPLANT
SYR TB 1ML LUER SLIP (SYRINGE) ×3 IMPLANT
WATER STERILE IRR 500ML POUR (IV SOLUTION) ×3 IMPLANT
WIPE NON LINTING 3.25X3.25 (MISCELLANEOUS) ×3 IMPLANT

## 2017-05-09 NOTE — Op Note (Signed)
OPERATIVE NOTE  Belinda Walsh [id number] 05/09/2017   PREOPERATIVE DIAGNOSIS:  Nuclear sclerotic cataract right eye.  H25.11   POSTOPERATIVE DIAGNOSIS:    Nuclear sclerotic cataract right eye.     PROCEDURE:  Phacoemusification with posterior chamber intraocular lens placement of the right eye   LENS:   Implant Name Type Inv. Item Serial No. Manufacturer Lot No. LRB No. Used  LENS IOL DIOP 20.0 - [id number] Intraocular Lens LENS IOL DIOP 20.0 [id number] AMO  Right 1       PCB00 +20.0   ULTRASOUND TIME: 0 minutes 35.8 seconds.  CDE 3.52   SURGEON:  Benay Pillow, MD, MPH  ANESTHESIOLOGIST: Anesthesiologist: Veda Canning, MD CRNA: Georga Bora, CRNA   ANESTHESIA:  Topical with tetracaine drops augmented with 1% preservative-free intracameral lidocaine.  ESTIMATED BLOOD LOSS: less than 1 mL.   COMPLICATIONS:  None.   DESCRIPTION OF PROCEDURE:  The patient was identified in the holding room and transported to the operating room and placed in the supine position under the operating microscope.  The right eye was identified as the operative eye and it was prepped and draped in the usual sterile ophthalmic fashion.   A 1.0 millimeter clear-corneal paracentesis was made at the 10:30 position. 0.5 ml of preservative-free 1% lidocaine with epinephrine was injected into the anterior chamber.  The anterior chamber was filled with Healon 5 viscoelastic.  A 2.4 millimeter keratome was used to make a near-clear corneal incision at the 8:00 position.  A curvilinear capsulorrhexis was made with a cystotome and capsulorrhexis forceps.  Balanced salt solution was used to hydrodissect and hydrodelineate the nucleus.   Phacoemulsification was then used in stop and chop fashion to remove the lens nucleus and epinucleus.  The remaining cortex was then removed using the irrigation and aspiration handpiece. Healon was then placed into the capsular bag to distend it for lens placement.  A lens was  then injected into the capsular bag.  The remaining viscoelastic was aspirated.   Wounds were hydrated with balanced salt solution.  The anterior chamber was inflated to a physiologic pressure with balanced salt solution.   Intracameral vigamox 0.1 mL undiluted was injected into the eye and a drop placed onto the ocular surface.  No wound leaks were noted.  The patient was taken to the recovery room in stable condition without complications of anesthesia or surgery  Benay Pillow 05/09/2017, 8:57 AM

## 2017-05-09 NOTE — H&P (Signed)
The History and Physical notes are on paper, have been signed, and are to be scanned.   I have examined the patient and there are no changes to the H&P.   Benay Pillow 05/09/2017 8:20 AM

## 2017-05-09 NOTE — Transfer of Care (Signed)
Immediate Anesthesia Transfer of Care Note  Patient: Belinda Walsh  Procedure(s) Performed: CATARACT EXTRACTION PHACO AND INTRAOCULAR LENS PLACEMENT (IOC) RIGHT (Right Eye)  Patient Location: PACU  Anesthesia Type: MAC  Level of Consciousness: awake, alert  and patient cooperative  Airway and Oxygen Therapy: Patient Spontanous Breathing and Patient connected to supplemental oxygen  Post-op Assessment: Post-op Vital signs reviewed, Patient's Cardiovascular Status Stable, Respiratory Function Stable, Patent Airway and No signs of Nausea or vomiting  Post-op Vital Signs: Reviewed and stable  Complications: No apparent anesthesia complications

## 2017-05-09 NOTE — Anesthesia Postprocedure Evaluation (Signed)
Anesthesia Post Note  Patient: Belinda Walsh  Procedure(s) Performed: CATARACT EXTRACTION PHACO AND INTRAOCULAR LENS PLACEMENT (IOC) RIGHT (Right Eye)  Patient location during evaluation: PACU Anesthesia Type: MAC Level of consciousness: awake and alert Pain management: pain level controlled Vital Signs Assessment: post-procedure vital signs reviewed and stable Respiratory status: spontaneous breathing, nonlabored ventilation, respiratory function stable and patient connected to nasal cannula oxygen Cardiovascular status: stable and blood pressure returned to baseline Postop Assessment: no apparent nausea or vomiting Anesthetic complications: no    Veda Canning

## 2017-05-09 NOTE — Anesthesia Preprocedure Evaluation (Signed)
Anesthesia Evaluation  Patient identified by MRN, date of birth, ID band Patient awake    Reviewed: Allergy & Precautions, NPO status , Patient's Chart, lab work & pertinent test results  History of Anesthesia Complications Negative for: history of anesthetic complications  Airway Mallampati: I  TM Distance: >3 FB Neck ROM: Full    Dental  (+) Edentulous Upper, Edentulous Lower, Lower Dentures, Upper Dentures   Pulmonary asthma ,    Pulmonary exam normal breath sounds clear to auscultation       Cardiovascular Exercise Tolerance: Good hypertension, + CAD, + Past MI (2014) and + Cardiac Stents  Normal cardiovascular exam Rhythm:Regular Rate:Normal     Neuro/Psych negative neurological ROS     GI/Hepatic negative GI ROS,   Endo/Other  negative endocrine ROS  Renal/GU negative Renal ROS     Musculoskeletal   Abdominal   Peds  Hematology  (+) Blood dyscrasia, anemia , Breast CA   Anesthesia Other Findings   Reproductive/Obstetrics                             Anesthesia Physical  Anesthesia Plan  ASA: III  Anesthesia Plan: MAC   Post-op Pain Management:    Induction: Intravenous  PONV Risk Score and Plan: 2 and Midazolam and TIVA  Airway Management Planned: Nasal Cannula  Additional Equipment:   Intra-op Plan:   Post-operative Plan:   Informed Consent: I have reviewed the patients History and Physical, chart, labs and discussed the procedure including the risks, benefits and alternatives for the proposed anesthesia with the patient or authorized representative who has indicated his/her understanding and acceptance.     Plan Discussed with: CRNA  Anesthesia Plan Comments:         Anesthesia Quick Evaluation

## 2017-05-10 ENCOUNTER — Encounter: Payer: Self-pay | Admitting: Ophthalmology

## 2017-11-20 ENCOUNTER — Emergency Department: Payer: Medicare HMO

## 2017-11-20 ENCOUNTER — Inpatient Hospital Stay
Admission: EM | Admit: 2017-11-20 | Discharge: 2017-12-17 | DRG: 308 | Disposition: E | Payer: Medicare HMO | Attending: Internal Medicine | Admitting: Internal Medicine

## 2017-11-20 DIAGNOSIS — N179 Acute kidney failure, unspecified: Secondary | ICD-10-CM | POA: Diagnosis present

## 2017-11-20 DIAGNOSIS — I472 Ventricular tachycardia, unspecified: Secondary | ICD-10-CM

## 2017-11-20 DIAGNOSIS — R6521 Severe sepsis with septic shock: Secondary | ICD-10-CM | POA: Diagnosis present

## 2017-11-20 DIAGNOSIS — Z9841 Cataract extraction status, right eye: Secondary | ICD-10-CM

## 2017-11-20 DIAGNOSIS — Z888 Allergy status to other drugs, medicaments and biological substances status: Secondary | ICD-10-CM

## 2017-11-20 DIAGNOSIS — E785 Hyperlipidemia, unspecified: Secondary | ICD-10-CM | POA: Diagnosis present

## 2017-11-20 DIAGNOSIS — I469 Cardiac arrest, cause unspecified: Secondary | ICD-10-CM

## 2017-11-20 DIAGNOSIS — Z853 Personal history of malignant neoplasm of breast: Secondary | ICD-10-CM

## 2017-11-20 DIAGNOSIS — R739 Hyperglycemia, unspecified: Secondary | ICD-10-CM | POA: Diagnosis present

## 2017-11-20 DIAGNOSIS — J189 Pneumonia, unspecified organism: Secondary | ICD-10-CM | POA: Diagnosis present

## 2017-11-20 DIAGNOSIS — Z6827 Body mass index (BMI) 27.0-27.9, adult: Secondary | ICD-10-CM

## 2017-11-20 DIAGNOSIS — J9601 Acute respiratory failure with hypoxia: Secondary | ICD-10-CM | POA: Diagnosis present

## 2017-11-20 DIAGNOSIS — D696 Thrombocytopenia, unspecified: Secondary | ICD-10-CM | POA: Diagnosis present

## 2017-11-20 DIAGNOSIS — G931 Anoxic brain damage, not elsewhere classified: Secondary | ICD-10-CM

## 2017-11-20 DIAGNOSIS — I10 Essential (primary) hypertension: Secondary | ICD-10-CM | POA: Diagnosis present

## 2017-11-20 DIAGNOSIS — R001 Bradycardia, unspecified: Secondary | ICD-10-CM | POA: Diagnosis not present

## 2017-11-20 DIAGNOSIS — Z803 Family history of malignant neoplasm of breast: Secondary | ICD-10-CM

## 2017-11-20 DIAGNOSIS — Z515 Encounter for palliative care: Secondary | ICD-10-CM

## 2017-11-20 DIAGNOSIS — Z66 Do not resuscitate: Secondary | ICD-10-CM | POA: Diagnosis not present

## 2017-11-20 DIAGNOSIS — A419 Sepsis, unspecified organism: Secondary | ICD-10-CM | POA: Diagnosis present

## 2017-11-20 DIAGNOSIS — Z79899 Other long term (current) drug therapy: Secondary | ICD-10-CM

## 2017-11-20 DIAGNOSIS — Z885 Allergy status to narcotic agent status: Secondary | ICD-10-CM

## 2017-11-20 DIAGNOSIS — Z7982 Long term (current) use of aspirin: Secondary | ICD-10-CM

## 2017-11-20 DIAGNOSIS — E669 Obesity, unspecified: Secondary | ICD-10-CM | POA: Diagnosis present

## 2017-11-20 DIAGNOSIS — I252 Old myocardial infarction: Secondary | ICD-10-CM

## 2017-11-20 DIAGNOSIS — Z961 Presence of intraocular lens: Secondary | ICD-10-CM | POA: Diagnosis present

## 2017-11-20 DIAGNOSIS — I2510 Atherosclerotic heart disease of native coronary artery without angina pectoris: Secondary | ICD-10-CM | POA: Diagnosis present

## 2017-11-20 DIAGNOSIS — G936 Cerebral edema: Secondary | ICD-10-CM | POA: Diagnosis present

## 2017-11-20 DIAGNOSIS — Z955 Presence of coronary angioplasty implant and graft: Secondary | ICD-10-CM

## 2017-11-20 DIAGNOSIS — G9382 Brain death: Secondary | ICD-10-CM | POA: Diagnosis not present

## 2017-11-20 DIAGNOSIS — R4189 Other symptoms and signs involving cognitive functions and awareness: Secondary | ICD-10-CM

## 2017-11-20 DIAGNOSIS — Z7951 Long term (current) use of inhaled steroids: Secondary | ICD-10-CM

## 2017-11-20 DIAGNOSIS — Z9842 Cataract extraction status, left eye: Secondary | ICD-10-CM

## 2017-11-20 DIAGNOSIS — E872 Acidosis: Secondary | ICD-10-CM | POA: Diagnosis present

## 2017-11-20 DIAGNOSIS — K567 Ileus, unspecified: Secondary | ICD-10-CM

## 2017-11-20 MED ORDER — DEXTROSE 5 % IV SOLN
300.0000 mg | Freq: Once | INTRAVENOUS | Status: AC
  Administered 2017-11-20: 300 mg via INTRAVENOUS
  Filled 2017-11-20: qty 6

## 2017-11-20 MED ORDER — AMIODARONE HCL IN DEXTROSE 360-4.14 MG/200ML-% IV SOLN
60.0000 mg/h | INTRAVENOUS | Status: DC
  Administered 2017-11-21: 30 mg/h via INTRAVENOUS
  Filled 2017-11-20: qty 200

## 2017-11-20 MED ORDER — AMIODARONE HCL IN DEXTROSE 360-4.14 MG/200ML-% IV SOLN
60.0000 mg/h | INTRAVENOUS | Status: AC
  Administered 2017-11-20: 60 mg/h via INTRAVENOUS

## 2017-11-20 NOTE — ED Triage Notes (Signed)
Pt arrives to ED via [facility] with c/o cardiac arrest. EMS states they were originally called out for respiratory distress, but pt went into asystole for the FD by the time EMS arrived. EMS reports several EKG changes en route, including asystole. EMS reports 8 rounds of Epi and 64meq of Bicarb given en route. Pt arrives with Edison Pace airway and Linton Rump in place.

## 2017-11-21 ENCOUNTER — Inpatient Hospital Stay: Payer: Medicare HMO

## 2017-11-21 ENCOUNTER — Other Ambulatory Visit: Payer: Self-pay

## 2017-11-21 ENCOUNTER — Other Ambulatory Visit: Payer: Medicare HMO

## 2017-11-21 ENCOUNTER — Emergency Department: Payer: Medicare HMO

## 2017-11-21 DIAGNOSIS — Z515 Encounter for palliative care: Secondary | ICD-10-CM

## 2017-11-21 DIAGNOSIS — Z955 Presence of coronary angioplasty implant and graft: Secondary | ICD-10-CM | POA: Diagnosis not present

## 2017-11-21 DIAGNOSIS — E872 Acidosis: Secondary | ICD-10-CM | POA: Diagnosis present

## 2017-11-21 DIAGNOSIS — G936 Cerebral edema: Secondary | ICD-10-CM | POA: Diagnosis present

## 2017-11-21 DIAGNOSIS — R739 Hyperglycemia, unspecified: Secondary | ICD-10-CM | POA: Diagnosis present

## 2017-11-21 DIAGNOSIS — Z9842 Cataract extraction status, left eye: Secondary | ICD-10-CM | POA: Diagnosis not present

## 2017-11-21 DIAGNOSIS — D696 Thrombocytopenia, unspecified: Secondary | ICD-10-CM | POA: Diagnosis present

## 2017-11-21 DIAGNOSIS — I2510 Atherosclerotic heart disease of native coronary artery without angina pectoris: Secondary | ICD-10-CM | POA: Diagnosis present

## 2017-11-21 DIAGNOSIS — R6521 Severe sepsis with septic shock: Secondary | ICD-10-CM | POA: Diagnosis present

## 2017-11-21 DIAGNOSIS — I10 Essential (primary) hypertension: Secondary | ICD-10-CM | POA: Diagnosis present

## 2017-11-21 DIAGNOSIS — A419 Sepsis, unspecified organism: Secondary | ICD-10-CM | POA: Diagnosis present

## 2017-11-21 DIAGNOSIS — J9601 Acute respiratory failure with hypoxia: Secondary | ICD-10-CM | POA: Diagnosis present

## 2017-11-21 DIAGNOSIS — I469 Cardiac arrest, cause unspecified: Secondary | ICD-10-CM | POA: Diagnosis present

## 2017-11-21 DIAGNOSIS — I472 Ventricular tachycardia, unspecified: Secondary | ICD-10-CM

## 2017-11-21 DIAGNOSIS — G931 Anoxic brain damage, not elsewhere classified: Secondary | ICD-10-CM

## 2017-11-21 DIAGNOSIS — G9382 Brain death: Secondary | ICD-10-CM | POA: Diagnosis not present

## 2017-11-21 DIAGNOSIS — Z6827 Body mass index (BMI) 27.0-27.9, adult: Secondary | ICD-10-CM | POA: Diagnosis not present

## 2017-11-21 DIAGNOSIS — E669 Obesity, unspecified: Secondary | ICD-10-CM | POA: Diagnosis present

## 2017-11-21 DIAGNOSIS — E785 Hyperlipidemia, unspecified: Secondary | ICD-10-CM | POA: Diagnosis present

## 2017-11-21 DIAGNOSIS — N179 Acute kidney failure, unspecified: Secondary | ICD-10-CM | POA: Diagnosis present

## 2017-11-21 DIAGNOSIS — R001 Bradycardia, unspecified: Secondary | ICD-10-CM | POA: Diagnosis not present

## 2017-11-21 DIAGNOSIS — J189 Pneumonia, unspecified organism: Secondary | ICD-10-CM | POA: Diagnosis present

## 2017-11-21 DIAGNOSIS — Z961 Presence of intraocular lens: Secondary | ICD-10-CM | POA: Diagnosis present

## 2017-11-21 DIAGNOSIS — Z66 Do not resuscitate: Secondary | ICD-10-CM | POA: Diagnosis not present

## 2017-11-21 DIAGNOSIS — R4189 Other symptoms and signs involving cognitive functions and awareness: Secondary | ICD-10-CM

## 2017-11-21 LAB — COMPREHENSIVE METABOLIC PANEL
ALT: 154 U/L — ABNORMAL HIGH (ref 0–44)
AST: 285 U/L — ABNORMAL HIGH (ref 15–41)
Albumin: 2.8 g/dL — ABNORMAL LOW (ref 3.5–5.0)
Alkaline Phosphatase: 105 U/L (ref 38–126)
Anion gap: 12 (ref 5–15)
BUN: 25 mg/dL — ABNORMAL HIGH (ref 8–23)
CO2: 16 mmol/L — ABNORMAL LOW (ref 22–32)
Calcium: 8.6 mg/dL — ABNORMAL LOW (ref 8.9–10.3)
Chloride: 112 mmol/L — ABNORMAL HIGH (ref 98–111)
Creatinine, Ser: 1.78 mg/dL — ABNORMAL HIGH (ref 0.44–1.00)
GFR calc Af Amer: 29 mL/min — ABNORMAL LOW (ref >60)
GFR calc non Af Amer: 25 mL/min — ABNORMAL LOW (ref >60)
Glucose, Bld: 378 mg/dL — ABNORMAL HIGH (ref 70–99)
Potassium: 3.4 mmol/L — ABNORMAL LOW (ref 3.5–5.1)
Sodium: 140 mmol/L (ref 135–145)
Total Bilirubin: 0.7 mg/dL (ref 0.3–1.2)
Total Protein: 5.8 g/dL — ABNORMAL LOW (ref 6.5–8.1)

## 2017-11-21 LAB — TRIGLYCERIDES: Triglycerides: 95 mg/dL (ref <150)

## 2017-11-21 LAB — CBC WITH DIFFERENTIAL/PLATELET
Abs Immature Granulocytes: 0.36 10*3/uL — ABNORMAL HIGH (ref 0.00–0.07)
Basophils Absolute: 0.1 10*3/uL (ref 0.0–0.1)
Basophils Relative: 1 %
EOS PCT: 1 %
Eosinophils Absolute: 0.1 10*3/uL (ref 0.0–0.5)
HEMATOCRIT: 35.2 % — AB (ref 36.0–46.0)
HEMOGLOBIN: 10.4 g/dL — AB (ref 12.0–15.0)
Immature Granulocytes: 2 %
LYMPHS ABS: 2.3 10*3/uL (ref 0.7–4.0)
LYMPHS PCT: 15 %
MCH: 27.2 pg (ref 26.0–34.0)
MCHC: 29.5 g/dL — AB (ref 30.0–36.0)
MCV: 92.1 fL (ref 80.0–100.0)
MONO ABS: 0.3 10*3/uL (ref 0.1–1.0)
MONOS PCT: 2 %
NEUTROS ABS: 12 10*3/uL — AB (ref 1.7–7.7)
Neutrophils Relative %: 79 %
Platelets: 116 10*3/uL — ABNORMAL LOW (ref 150–400)
RBC: 3.82 MIL/uL — ABNORMAL LOW (ref 3.87–5.11)
RDW: 15.5 % (ref 11.5–15.5)
WBC: 15.1 10*3/uL — ABNORMAL HIGH (ref 4.0–10.5)
nRBC: 0.3 % — ABNORMAL HIGH (ref 0.0–0.2)

## 2017-11-21 LAB — BLOOD GAS, ARTERIAL
ACID-BASE DEFICIT: 4.9 mmol/L — AB (ref 0.0–2.0)
BICARBONATE: 20.6 mmol/L (ref 20.0–28.0)
FIO2: 0.5
MECHVT: 450 mL
Mechanical Rate: 22
O2 SAT: 99.3 %
PATIENT TEMPERATURE: 37
PCO2 ART: 39 mmHg (ref 32.0–48.0)
PEEP: 5 cmH2O
PH ART: 7.33 — AB (ref 7.350–7.450)
PO2 ART: 158 mmHg — AB (ref 83.0–108.0)

## 2017-11-21 LAB — BASIC METABOLIC PANEL
ANION GAP: 15 (ref 5–15)
BUN: 22 mg/dL (ref 8–23)
CALCIUM: 8.6 mg/dL — AB (ref 8.9–10.3)
CO2: 18 mmol/L — AB (ref 22–32)
Chloride: 108 mmol/L (ref 98–111)
Creatinine, Ser: 1.86 mg/dL — ABNORMAL HIGH (ref 0.44–1.00)
GFR calc Af Amer: 28 mL/min — ABNORMAL LOW (ref >60)
GFR calc non Af Amer: 24 mL/min — ABNORMAL LOW (ref >60)
Glucose, Bld: 377 mg/dL — ABNORMAL HIGH (ref 70–99)
Potassium: 5.1 mmol/L (ref 3.5–5.1)
Sodium: 141 mmol/L (ref 135–145)

## 2017-11-21 LAB — URINALYSIS, ROUTINE W REFLEX MICROSCOPIC
Bilirubin Urine: NEGATIVE
Glucose, UA: 50 mg/dL — AB
KETONES UR: NEGATIVE mg/dL
Leukocytes, UA: NEGATIVE
Nitrite: NEGATIVE
PH: 5 (ref 5.0–8.0)
PROTEIN: 100 mg/dL — AB
Specific Gravity, Urine: 1.023 (ref 1.005–1.030)

## 2017-11-21 LAB — CBC
HEMATOCRIT: 35.1 % — AB (ref 36.0–46.0)
HEMOGLOBIN: 10.6 g/dL — AB (ref 12.0–15.0)
MCH: 27.6 pg (ref 26.0–34.0)
MCHC: 30.2 g/dL (ref 30.0–36.0)
MCV: 91.4 fL (ref 80.0–100.0)
Platelets: 92 10*3/uL — ABNORMAL LOW (ref 150–400)
RBC: 3.84 MIL/uL — ABNORMAL LOW (ref 3.87–5.11)
RDW: 15.1 % (ref 11.5–15.5)
WBC: 14.4 10*3/uL — ABNORMAL HIGH (ref 4.0–10.5)
nRBC: 0.3 % — ABNORMAL HIGH (ref 0.0–0.2)

## 2017-11-21 LAB — INFLUENZA PANEL BY PCR (TYPE A & B)
Influenza A By PCR: NEGATIVE
Influenza B By PCR: NEGATIVE

## 2017-11-21 LAB — GLUCOSE, CAPILLARY
Glucose-Capillary: 273 mg/dL — ABNORMAL HIGH (ref 70–99)
Glucose-Capillary: 252 mg/dL — ABNORMAL HIGH (ref 70–99)
Glucose-Capillary: 248 mg/dL — ABNORMAL HIGH (ref 70–99)

## 2017-11-21 LAB — LACTIC ACID, PLASMA
Lactic Acid, Venous: 6.3 mmol/L (ref 0.5–1.9)
Lactic Acid, Venous: 4.6 mmol/L (ref 0.5–1.9)

## 2017-11-21 LAB — MAGNESIUM: MAGNESIUM: 2.3 mg/dL (ref 1.7–2.4)

## 2017-11-21 LAB — TROPONIN I
Troponin I: 0.1 ng/mL (ref <0.03)
Troponin I: 0.5 ng/mL (ref <0.03)

## 2017-11-21 LAB — MRSA PCR SCREENING: MRSA by PCR: POSITIVE — AB

## 2017-11-21 LAB — TSH: TSH: 24.434 u[IU]/mL — ABNORMAL HIGH (ref 0.350–4.500)

## 2017-11-21 LAB — PROCALCITONIN: PROCALCITONIN: 5.9 ng/mL

## 2017-11-21 MED ORDER — CHLORHEXIDINE GLUCONATE 0.12% ORAL RINSE (MEDLINE KIT)
15.0000 mL | Freq: Two times a day (BID) | OROMUCOSAL | Status: DC
  Administered 2017-11-21: 15 mL via OROMUCOSAL

## 2017-11-21 MED ORDER — SODIUM BICARBONATE 8.4 % IV SOLN
150.0000 meq | Freq: Once | INTRAVENOUS | Status: AC
  Administered 2017-11-21: 150 meq via INTRAVENOUS
  Filled 2017-11-21: qty 150

## 2017-11-21 MED ORDER — SODIUM CHLORIDE 0.9 % IV SOLN
1.0000 g | INTRAVENOUS | Status: DC
  Filled 2017-11-21: qty 1

## 2017-11-21 MED ORDER — ASPIRIN EC 81 MG PO TBEC: 81.0000 mg | DELAYED_RELEASE_TABLET | Freq: Every day | ORAL | Status: DC

## 2017-11-21 MED ORDER — PRAVASTATIN SODIUM 40 MG PO TABS
80.0000 mg | ORAL_TABLET | Freq: Every day | ORAL | Status: DC
  Filled 2017-11-21: qty 2

## 2017-11-21 MED ORDER — ONDANSETRON HCL 4 MG/2ML IJ SOLN: 4.0000 mg | Freq: Four times a day (QID) | INTRAMUSCULAR | Status: DC | PRN

## 2017-11-21 MED ORDER — PROPOFOL 1000 MG/100ML IV EMUL: 5.0000 ug/kg/min | INTRAVENOUS | Status: DC

## 2017-11-21 MED ORDER — NOREPINEPHRINE 4 MG/250ML-% IV SOLN
0.0000 ug/min | INTRAVENOUS | Status: DC
  Administered 2017-11-21 (×2): 5 ug/min via INTRAVENOUS
  Filled 2017-11-21 (×2): qty 250

## 2017-11-21 MED ORDER — VANCOMYCIN HCL IN DEXTROSE 1-5 GM/200ML-% IV SOLN
1000.0000 mg | INTRAVENOUS | Status: DC
  Filled 2017-11-21: qty 200

## 2017-11-21 MED ORDER — ATROPINE SULFATE 1 MG/ML IJ SOLN
INTRAMUSCULAR | Status: AC | PRN
  Administered 2017-11-20: 1 mg via INTRAVENOUS

## 2017-11-21 MED ORDER — ORAL CARE MOUTH RINSE
15.0000 mL | OROMUCOSAL | Status: DC
  Administered 2017-11-21 (×2): 15 mL via OROMUCOSAL

## 2017-11-21 MED ORDER — FAMOTIDINE 20 MG PO TABS: 20.0000 mg | ORAL_TABLET | Freq: Every day | ORAL | Status: DC

## 2017-11-21 MED ORDER — PNEUMOCOCCAL VAC POLYVALENT 25 MCG/0.5ML IJ INJ: 0.5000 mL | INJECTION | INTRAMUSCULAR | Status: DC

## 2017-11-21 MED ORDER — ACETAMINOPHEN 325 MG PO TABS: 650.0000 mg | ORAL_TABLET | Freq: Four times a day (QID) | ORAL | Status: DC | PRN

## 2017-11-21 MED ORDER — AMLODIPINE BESYLATE 5 MG PO TABS: 10.0000 mg | ORAL_TABLET | Freq: Every day | ORAL | Status: DC

## 2017-11-21 MED ORDER — VANCOMYCIN HCL IN DEXTROSE 1-5 GM/200ML-% IV SOLN
1000.0000 mg | Freq: Once | INTRAVENOUS | Status: AC
  Administered 2017-11-21: 1000 mg via INTRAVENOUS
  Filled 2017-11-21: qty 200

## 2017-11-21 MED ORDER — INFLUENZA VAC SPLIT HIGH-DOSE 0.5 ML IM SUSY: 0.5000 mL | PREFILLED_SYRINGE | INTRAMUSCULAR | Status: DC

## 2017-11-21 MED ORDER — LORAZEPAM 2 MG/ML IJ SOLN: 1.0000 mg | INTRAMUSCULAR | Status: DC | PRN

## 2017-11-21 MED ORDER — VANCOMYCIN HCL 10 G IV SOLR: 1.0000 g | Freq: Once | INTRAVENOUS | Status: DC

## 2017-11-21 MED ORDER — SODIUM CHLORIDE 0.9 % IV BOLUS (SEPSIS)
1000.0000 mL | Freq: Once | INTRAVENOUS | Status: AC
  Administered 2017-11-21: 1000 mL via INTRAVENOUS

## 2017-11-21 MED ORDER — SODIUM CHLORIDE 0.9 % IV SOLN
1.0000 g | Freq: Once | INTRAVENOUS | Status: AC
  Administered 2017-11-21: 1 g via INTRAVENOUS
  Filled 2017-11-21: qty 1

## 2017-11-21 MED ORDER — SODIUM CHLORIDE 0.9 % IV BOLUS
500.0000 mL | Freq: Once | INTRAVENOUS | Status: AC
  Administered 2017-11-21: 06:00:00 via INTRAVENOUS

## 2017-11-21 MED ORDER — ACETAMINOPHEN 650 MG RE SUPP: 650.0000 mg | Freq: Four times a day (QID) | RECTAL | Status: DC | PRN

## 2017-11-21 MED ORDER — BISACODYL 10 MG RE SUPP: 10.0000 mg | Freq: Every day | RECTAL | Status: DC | PRN

## 2017-11-21 MED ORDER — HEPARIN SODIUM (PORCINE) 5000 UNIT/ML IJ SOLN
5000.0000 [IU] | Freq: Three times a day (TID) | INTRAMUSCULAR | Status: DC
  Administered 2017-11-21: 5000 [IU] via SUBCUTANEOUS
  Filled 2017-11-21: qty 1

## 2017-11-21 MED ORDER — SODIUM CHLORIDE 0.9 % IV SOLN
INTRAVENOUS | Status: DC
  Administered 2017-11-21: 04:00:00 via INTRAVENOUS

## 2017-11-21 MED ORDER — POTASSIUM CHLORIDE 10 MEQ/100ML IV SOLN
10.0000 meq | INTRAVENOUS | Status: AC
  Administered 2017-11-21: 10 meq via INTRAVENOUS
  Filled 2017-11-21 (×2): qty 100

## 2017-11-21 MED ORDER — METOPROLOL TARTRATE 25 MG PO TABS: 25.0000 mg | ORAL_TABLET | Freq: Two times a day (BID) | ORAL | Status: DC

## 2017-11-21 MED ORDER — ONDANSETRON HCL 4 MG PO TABS: 4.0000 mg | ORAL_TABLET | Freq: Four times a day (QID) | ORAL | Status: DC | PRN

## 2017-11-21 MED ORDER — CALCIUM CHLORIDE 10 % IV SOLN
INTRAVENOUS | Status: AC | PRN
  Administered 2017-11-20: 1 g via INTRAVENOUS

## 2017-11-21 MED ORDER — ENOXAPARIN SODIUM 40 MG/0.4ML ~~LOC~~ SOLN: 40.0000 mg | SUBCUTANEOUS | Status: DC

## 2017-11-21 MED ORDER — DOCUSATE SODIUM 100 MG PO CAPS: 100.0000 mg | ORAL_CAPSULE | Freq: Two times a day (BID) | ORAL | Status: DC

## 2017-11-21 MED ORDER — INSULIN ASPART 100 UNIT/ML ~~LOC~~ SOLN
0.0000 [IU] | SUBCUTANEOUS | Status: DC
  Administered 2017-11-21 (×2): 5 [IU] via SUBCUTANEOUS
  Filled 2017-11-21 (×2): qty 1

## 2017-11-21 MED ORDER — FENTANYL CITRATE (PF) 100 MCG/2ML IJ SOLN: 25.0000 ug | INTRAMUSCULAR | Status: DC | PRN

## 2017-11-21 MED ORDER — SODIUM CHLORIDE 0.9 % IV BOLUS (SEPSIS)
250.0000 mL | Freq: Once | INTRAVENOUS | Status: AC
  Administered 2017-11-21: 250 mL via INTRAVENOUS

## 2017-11-21 MED ORDER — ALBUTEROL SULFATE (2.5 MG/3ML) 0.083% IN NEBU: 2.5000 mg | INHALATION_SOLUTION | Freq: Four times a day (QID) | RESPIRATORY_TRACT | Status: DC | PRN

## 2017-11-21 MED ORDER — FAMOTIDINE IN NACL 20-0.9 MG/50ML-% IV SOLN
20.0000 mg | Freq: Every day | INTRAVENOUS | Status: DC
  Administered 2017-11-21: 20 mg via INTRAVENOUS
  Filled 2017-11-21: qty 50

## 2017-11-21 MED ORDER — PROPOFOL 1000 MG/100ML IV EMUL
5.0000 ug/kg/min | Freq: Once | INTRAVENOUS | Status: DC
  Administered 2017-11-21: 5 ug/kg/min via INTRAVENOUS

## 2017-11-21 MED ORDER — FLUTICASONE FUROATE-VILANTEROL 200-25 MCG/INH IN AEPB
1.0000 | INHALATION_SPRAY | Freq: Every day | RESPIRATORY_TRACT | Status: DC
  Filled 2017-11-21: qty 28

## 2017-11-21 MED FILL — Medication: Qty: 1 | Status: AC

## 2017-11-22 LAB — URINE CULTURE: Culture: NO GROWTH

## 2017-11-23 LAB — CULTURE, RESPIRATORY: CULTURE: NORMAL

## 2017-11-23 LAB — CULTURE, RESPIRATORY W GRAM STAIN

## 2017-11-26 LAB — CULTURE, BLOOD (ROUTINE X 2)
Culture: NO GROWTH
Culture: NO GROWTH
SPECIAL REQUESTS: ADEQUATE
Special Requests: ADEQUATE

## 2017-12-06 LAB — BLOOD GAS, ARTERIAL
Acid-base deficit: 13.3 mmol/L — ABNORMAL HIGH (ref 0.0–2.0)
Bicarbonate: 15.9 mmol/L — ABNORMAL LOW (ref 20.0–28.0)
FIO2: 0.5
MECHVT: 450 mL
O2 SAT: 98 %
PCO2 ART: 50 mmHg — AB (ref 32.0–48.0)
PEEP: 5 cmH2O
Patient temperature: 37
RATE: 18 resp/min

## 2017-12-17 NOTE — Progress Notes (Signed)
   Nov 26, 2017 1045  Clinical Encounter Type  Visited With Patient and family together  Visit Type Initial  Referral From Other (Comment) (Financial controller)  Spiritual Encounters  Spiritual Needs Emotional;Prayer   Upon staff recommendation, chaplain made introductory visit offering emotional support.  Chaplain provided prayer shawl which patient's son used to cover patient.  Chaplain and family prayed together.  Family open to ongoing chaplain support.

## 2017-12-17 NOTE — Death Summary Note (Signed)
DEATH SUMMARY   Patient Details  Name: Belinda Walsh MRN: [id number] DOB: 1932-03-20  Admission/Discharge Information   Admit Date:  2017-12-01  Date of Death:  12/02/2017  Time of Death:  145PM  Length of Stay: 0  Referring Physician: Ezequiel Kayser, MD   Reason(s) for Hospitalization  Admitted for acute cardiac arrest, V TAch, sudden cardiac death RUL opacity on CXR pneumonia   Diagnoses  Preliminary cause of death:  Secondary Diagnoses (including complications and co-morbidities):  Active Problems:   V-tach [facility])   Palliative care by specialist   Terminal care   Cardiac arrest [facility])   Anoxic brain injury [facility])   [facility] (including significant findings, care, treatment, and services provided and events leading to death)  admitted for acute cardiac arrest from V tach/ RUL opacity presumed pneumonia Progressive multiorgan failure Severe anoxic brain injury  Family consented and agreed to DNR status and Comfort care measures      Pertinent Labs and Studies  Significant Diagnostic Studies Dg Abd 1 View  Result Date: 12-02-17 CLINICAL DATA:  Followup abdominal ileus. EXAM: ABDOMEN - 1 VIEW COMPARISON:  12-01-17 FINDINGS: Persistent air-filled small bowel loops but slightly improved when compared to prior study. There is some air and stool noted in the right colon. No obvious free air. IMPRESSION: Improved bowel gas pattern. Persistent air-filled loops of small bowel but less so than prior study. There is also some air in stool in the right colon. Electronically Signed   By: Marijo Sanes M.D.   On: 12-02-2017 12:42   Ct Head Wo Contrast  Result Date: 12/02/17 CLINICAL DATA:  Cardiac arrest. EXAM: CT HEAD WITHOUT CONTRAST TECHNIQUE: Contiguous axial images were obtained from the base of the skull through the vertex without intravenous contrast. COMPARISON:  None. FINDINGS: Brain: There is symmetric edema throughout the basal ganglia  bilaterally. There is decreased cortical gray-white differentiation more conspicuous in the right than the left cerebral hemispheres. There is no midline shift or tonsillar herniation. No intracranial hemorrhage or extra-axial fluid collection is identified. The ventricles are small. Vascular: Calcified atherosclerosis at the skull base. No hyperdense vessel. Skull: No fracture or focal osseous lesion. Sinuses/Orbits: Fluid in the paranasal sinuses, nasal cavity, and pharynx likely related intubation. Clear mastoid air cells. Bilateral cataract extraction. Other: None. IMPRESSION: 1. Cerebral edema consistent with anoxic injury.  No herniation. 2. No intracranial hemorrhage. Electronically Signed   By: Logan Bores M.D.   On: 2017-12-02 01:38   Dg Chest Portable 1 View  Result Date: 2017-12-02 CLINICAL DATA:  ETT adjustment EXAM: PORTABLE CHEST 1 VIEW COMPARISON:  12/01/17 FINDINGS: Endotracheal tube tip is about 3 cm superior to the carina. Esophageal tube tip is below the diaphragm. Mild ground-glass opacity in the right upper lobe. Stable cardiomediastinal silhouette. Aortic atherosclerosis. No pneumothorax. IMPRESSION: 1. Endotracheal tube tip about 3 cm superior to carina 2. No change in mild ground-glass infiltrate in the right upper lobe Electronically Signed   By: Donavan Foil M.D.   On: 2017/12/02 00:11   Dg Chest Portable 1 View  Result Date: December 02, 2017 CLINICAL DATA:  Cardiac arrest EXAM: PORTABLE CHEST 1 VIEW COMPARISON:  None. FINDINGS: Endotracheal tube tip is near the carina. Esophageal tube tip is below the diaphragm but non included. Normal heart size. Mild ground-glass opacity in the right upper lobe. Aortic atherosclerosis. No pneumothorax. Right shoulder calcific tendinitis. IMPRESSION: 1. Endotracheal tube tip at the carina. 2. Mild ground-glass opacity in the right upper  lobe, possible pneumonia Electronically Signed   By: Donavan Foil M.D.   On: 2017/12/20 00:10   Dg Abd Portable  1 View  Result Date: December 20, 2017 CLINICAL DATA:  OG tube placement EXAM: PORTABLE ABDOMEN - 1 VIEW COMPARISON:  None. FINDINGS: Lung bases are clear. Esophageal tube tip overlies the gastric body. Multiple loops of borderline enlarged small bowel up to 3 cm. IMPRESSION: 1. Esophageal tube tip overlies the gastric body 2. Multiple air-filled mildly distended small bowel loops could relate to an ileus versus partial obstruction Electronically Signed   By: Donavan Foil M.D.   On: 20-Dec-2017 00:10    Microbiology Recent Results (from the past 240 hour(s))  Blood Culture (routine x 2)     Status: None (Preliminary result)   Collection Time: 12-20-2017  2:20 AM  Result Value Ref Range Status   Specimen Description BLOOD LEFT FATTY CASTS  Final   Special Requests   Final    BOTTLES DRAWN AEROBIC AND ANAEROBIC Blood Culture adequate volume   Culture   Final    NO GROWTH < 12 HOURS Performed at [facility], [address], [place], [place] [address]    Report Status PENDING  Incomplete  Blood Culture (routine x 2)     Status: None (Preliminary result)   Collection Time: December 20, 2017  3:51 AM  Result Value Ref Range Status   Specimen Description BLOOD RIGHT AC  Final   Special Requests   Final    BOTTLES DRAWN AEROBIC AND ANAEROBIC Blood Culture adequate volume   Culture   Final    NO GROWTH < 12 HOURS Performed at [facility], [address] [place], [place] [address]    Report Status PENDING  Incomplete  MRSA PCR Screening     Status: Abnormal   Collection Time: 20-Dec-2017  4:17 AM  Result Value Ref Range Status   MRSA by PCR POSITIVE (A) NEGATIVE Final    Comment:        The GeneXpert MRSA Assay (FDA approved for NASAL specimens only), is one component of a comprehensive MRSA colonization surveillance program. It is not intended to diagnose MRSA infection nor to guide or monitor treatment for MRSA infections. RESULT CALLED TO, READ BACK BY AND VERIFIED WITH:  [place]in Coder AT 0559 12-20-17 SDR Performed at [facility], [address] [place], [place] [address]   Culture, respiratory (non-expectorated)     Status: None (Preliminary result)   Collection Time: 20-Dec-2017  6:15 AM  Result Value Ref Range Status   Specimen Description   Final    TRACHEAL ASPIRATE Performed at [facility], [address], [place], [place] [address]    Special Requests   Final    NONE Performed at [facility], [address] [place], [place] [address]    Gram Stain   Final    RARE WBC PRESENT, PREDOMINANTLY PMN MODERATE GRAM POSITIVE COCCI FEW GRAM POSITIVE RODS FEW YEAST Performed at [facility], [place] [address] [place], [place] [address]    Culture PENDING  Incomplete   Report Status PENDING  Incomplete    Lab Basic Metabolic Panel: Recent Labs  Lab 12/02/2017 2332 December 20, 2017 0447 12-20-17 0625  NA 141 140  --   K 5.1 3.4*  --   CL 108 112*  --   CO2 18* 16*  --   GLUCOSE 377* 378*  --   BUN 22 25*  --   CREATININE 1.86* 1.78*  --  CALCIUM 8.6* 8.6*  --   MG  --   --  2.3   Liver Function Tests: Recent Labs  Lab 11-28-17 0447  AST 285*  ALT 154*  ALKPHOS 105  BILITOT 0.7  PROT 5.8*  ALBUMIN 2.8*   No results for input(s): LIPASE, AMYLASE in the last 168 hours. No results for input(s): AMMONIA in the last 168 hours. CBC: Recent Labs  Lab 11/24/2017 2332 November 28, 2017 0351  WBC 14.4* 15.1*  NEUTROABS  --  12.0*  HGB 10.6* 10.4*  HCT 35.1* 35.2*  MCV 91.4 92.1  PLT 92* 116*   Cardiac Enzymes: Recent Labs  Lab 11/26/2017 2332 11/28/17 0625  TROPONINI 0.10* 0.50*   Sepsis Labs: Recent Labs  Lab 12/01/2017 2332 11-28-2017 0220 2017/11/28 0351 11/28/17 0452  PROCALCITON  --   --   --  5.90  WBC 14.4*  --  15.1*  --   LATICACIDVEN  --  6.3* 4.6*  --

## 2017-12-17 NOTE — H&P (Signed)
Belinda Walsh is an 84 y.o. female.   Chief Complaint: Cardiac arrest HPI: Patient with past medical history of hypertension, CAD status post myocardial infarction and stent placement presents to the emergency department via EMS in cardiac arrest.  Patient was reportedly in her usual state of health this afternoon but was found down without a heartbeat.  She was reportedly asystolic in the field.  CPR was initiated and the patient received 8 doses of epinephrine with return of circulation and subsequent loss of pulse at least twice prior to achieving normal sinus rhythm.  She was given an amiodarone bolus and drip prior to the emergency department staff calling the hospitalist service for admission.  Past Medical History:  Diagnosis Date  . Anemia   . Breast cancer ([place]) 2011   radiation  . Cancer [facility]) 2011   Left breast  . Coronary artery disease   . Hypertension   . Myocardial infarction [facility]) 2014   stent placement    Past Surgical History:  Procedure Laterality Date  . BREAST BIOPSY Left 2011   core bx- positive  . BREAST EXCISIONAL BIOPSY Left 2011  . CATARACT EXTRACTION W/PHACO Left 04/11/2017   Procedure: CATARACT EXTRACTION PHACO AND INTRAOCULAR LENS PLACEMENT (IOC) LEFT;  Surgeon: Eulogio Bear, MD;  Location: [place];  Service: Ophthalmology;  Laterality: Left;  . CATARACT EXTRACTION W/PHACO Right 05/09/2017   Procedure: CATARACT EXTRACTION PHACO AND INTRAOCULAR LENS PLACEMENT ([place]) RIGHT;  Surgeon: Eulogio Bear, MD;  Location: [place];  Service: Ophthalmology;  Laterality: Right;  . COLONOSCOPY    . CORONARY ANGIOPLASTY  2014   stent placement  . FOOT SURGERY Bilateral 1984    Family History  Problem Relation Age of Onset  . Breast cancer Cousin   . Breast cancer Sister 23   Social History:  reports that she has never smoked. She has never used smokeless tobacco. Her alcohol and drug histories are not on file.  Allergies:   Allergies  Allergen Reactions  . Lisinopril Cough  . Oxycodone Itching    Increase HR per patient  . Losartan Rash    Medications Prior to Admission  Medication Sig Dispense Refill  . albuterol (PROVENTIL) (2.5 MG/3ML) 0.083% nebulizer solution Inhale 2.5 mg into the lungs every 6 (six) hours as needed for wheezing.  5  . amLODipine (NORVASC) 10 MG tablet Take 10 mg by mouth daily.  3  . aspirin EC 81 MG tablet Take 81 mg by mouth daily.     . fluticasone-salmeterol (ADVAIR HFA) 115-21 MCG/ACT inhaler Inhale 2 puffs into the lungs 2 (two) times daily.    . metoprolol tartrate (LOPRESSOR) 25 MG tablet Take 25 mg by mouth 2 (two) times daily.  3  . pravastatin (PRAVACHOL) 80 MG tablet Take 80 mg by mouth at bedtime.  3    Results for orders placed or performed during the hospital encounter of 12/08/2017 (from the past 48 hour(s))  Urinalysis, Routine w reflex microscopic     Status: Abnormal   Collection Time: 12/08/2017 11:21 PM  Result Value Ref Range   Color, Urine YELLOW (A) YELLOW   APPearance HAZY (A) CLEAR   Specific Gravity, Urine 1.023 1.005 - 1.030   pH 5.0 5.0 - 8.0   Glucose, UA 50 (A) NEGATIVE mg/dL   Hgb urine dipstick MODERATE (A) NEGATIVE   Bilirubin Urine NEGATIVE NEGATIVE   Ketones, ur NEGATIVE NEGATIVE mg/dL   Protein, ur 100 (A) NEGATIVE mg/dL   Nitrite  NEGATIVE NEGATIVE   Leukocytes, UA NEGATIVE NEGATIVE   RBC / HPF 6-10 0 - 5 RBC/hpf   WBC, UA 6-10 0 - 5 WBC/hpf   Bacteria, UA RARE (A) NONE SEEN   Squamous Epithelial / LPF 0-5 0 - 5   Mucus PRESENT    Hyaline Casts, UA PRESENT     Comment: Performed at [facility], [address] [place], [place] [address]  Basic metabolic panel     Status: Abnormal   Collection Time: 12/10/2017 11:32 PM  Result Value Ref Range   Sodium 141 135 - 145 mmol/L   Potassium 5.1 3.5 - 5.1 mmol/L    Comment: HEMOLYSIS AT THIS LEVEL MAY AFFECT RESULT   Chloride 108 98 - 111 mmol/L   CO2 18 (L) 22 - 32 mmol/L    Glucose, Bld 377 (H) 70 - 99 mg/dL   BUN 22 8 - 23 mg/dL   Creatinine, Ser 1.86 (H) 0.44 - 1.00 mg/dL   Calcium 8.6 (L) 8.9 - 10.3 mg/dL   GFR calc non Af Amer 24 (L) >60 mL/min   GFR calc Af Amer 28 (L) >60 mL/min    Comment: (NOTE) The eGFR has been calculated using the CKD EPI equation. This calculation has not been validated in all clinical situations. eGFR's persistently <60 mL/min signify possible Chronic Kidney Disease.    Anion gap 15 5 - 15    Comment: Performed at [facility], [address] [place], [place] [address]  CBC     Status: Abnormal   Collection Time: 11/24/2017 11:32 PM  Result Value Ref Range   WBC 14.4 (H) 4.0 - 10.5 K/uL   RBC 3.84 (L) 3.87 - 5.11 MIL/uL   Hemoglobin 10.6 (L) 12.0 - 15.0 g/dL   HCT 35.1 (L) 36.0 - 46.0 %   MCV 91.4 80.0 - 100.0 fL   MCH 27.6 26.0 - 34.0 pg   MCHC 30.2 30.0 - 36.0 g/dL   RDW 15.1 11.5 - 15.5 %   Platelets 92 (L) 150 - 400 K/uL    Comment: PLATELET CLUMPS NOTED ON SMEAR, UNABLE TO ESTIMATE Immature Platelet Fraction may be clinically indicated, consider ordering this additional test [id number]    nRBC 0.3 (H) 0.0 - 0.2 %    Comment: Performed at [facility], [address] [place], [place] [address]  Troponin I     Status: Abnormal   Collection Time: 11/26/2017 11:32 PM  Result Value Ref Range   Troponin I 0.10 (HH) <0.03 ng/mL    Comment: CRITICAL RESULT CALLED TO, READ BACK BY AND VERIFIED WITH BUTCH WOODS _0  2017/12/18 [facility] Performed at [facility], [address] [place], [place] [address]   Triglycerides     Status: None   Collection Time: 12/18/2017 12:28 AM  Result Value Ref Range   Triglycerides 95 <150 mg/dL    Comment: Performed at [facility], [address] [place], [place] [address]  TSH     Status: Abnormal   Collection Time: 12/18/2017 12:28 AM  Result Value Ref Range   TSH 24.434 (H) 0.350 - 4.500 uIU/mL    Comment: Performed by a 3rd Generation assay with a  functional sensitivity of <=0.01 uIU/mL. Performed at [facility], [address] [place],  [address]   Influenza panel by PCR (type A & B)     Status: None   Collection Time: December 18, 2017 12:42 AM  Result Value Ref Range   Influenza A By  PCR NEGATIVE NEGATIVE   Influenza B By PCR NEGATIVE NEGATIVE    Comment: (NOTE) The Xpert Xpress Flu assay is intended as an aid in the diagnosis of  influenza and should not be used as a sole basis for treatment.  This  assay is FDA approved for nasopharyngeal swab specimens only. Nasal  washings and aspirates are unacceptable for Xpert Xpress Flu testing. Performed at [facility], [address] [place], [place] [address]   Lactic acid, plasma     Status: Abnormal   Collection Time: 2017/12/14  2:20 AM  Result Value Ref Range   Lactic Acid, Venous 6.3 (HH) 0.5 - 1.9 mmol/L    Comment: CRITICAL RESULT CALLED TO, READ BACK BY AND VERIFIED WITH BUTCH WOODS _0  Dec 14, 2017 [facility] Performed at [facility], [address] [place], [place] [address]   Lactic acid, plasma     Status: Abnormal   Collection Time: 2017/12/14  3:51 AM  Result Value Ref Range   Lactic Acid, Venous 4.6 (HH) 0.5 - 1.9 mmol/L    Comment: CRITICAL RESULT CALLED TO, READ BACK BY AND VERIFIED WITH BETH BUONO 12-14-2017 0425 [facility] Performed at [facility], [address] [place], [place] [address]   CBC with Differential/Platelet     Status: Abnormal   Collection Time: 12-14-17  3:51 AM  Result Value Ref Range   WBC 15.1 (H) 4.0 - 10.5 K/uL   RBC 3.82 (L) 3.87 - 5.11 MIL/uL   Hemoglobin 10.4 (L) 12.0 - 15.0 g/dL   HCT 35.2 (L) 36.0 - 46.0 %   MCV 92.1 80.0 - 100.0 fL   MCH 27.2 26.0 - 34.0 pg   MCHC 29.5 (L) 30.0 - 36.0 g/dL   RDW 15.5 11.5 - 15.5 %   Platelets 116 (L) 150 - 400 K/uL   nRBC 0.3 (H) 0.0 - 0.2 %   Neutrophils Relative % 79 %   Neutro Abs 12.0 (H) 1.7 - 7.7 K/uL   Lymphocytes Relative 15 %   Lymphs Abs 2.3 0.7 - 4.0  K/uL   Monocytes Relative 2 %   Monocytes Absolute 0.3 0.1 - 1.0 K/uL   Eosinophils Relative 1 %   Eosinophils Absolute 0.1 0.0 - 0.5 K/uL   Basophils Relative 1 %   Basophils Absolute 0.1 0.0 - 0.1 K/uL   Immature Granulocytes 2 %   Abs Immature Granulocytes 0.36 (H) 0.00 - 0.07 K/uL    Comment: Performed at [facility], [address] [place], [place] [address]  Blood gas, arterial     Status: Abnormal (Preliminary result)   Collection Time: 12-14-2017  3:55 AM  Result Value Ref Range   FIO2 0.50    Delivery systems VENTILATOR    Mode PRESSURE REGULATED VOLUME CONTROL    VT 450 mL   LHR 18 resp/min   Peep/cpap 5.0 cm H20   pH, Arterial PENDING 7.350 - 7.450   pCO2 arterial 50 (H) 32.0 - 48.0 mmHg    Comment: CRITICAL RESULT CALLED TO, READ BACK BY AND VERIFIED WITH:  BETH, RN AT 0417 ON [id number]    pO2, Arterial PENDING 83.0 - 108.0 mmHg   Bicarbonate 15.9 (L) 20.0 - 28.0 mmol/L   Acid-base deficit 13.3 (H) 0.0 - 2.0 mmol/L   O2 Saturation 98.0 %   Patient temperature 37.0    Collection site LEFT BRACHIAL    Sample type ARTERIAL DRAW     Comment: Performed at [facility], [address]  [place], [place] [address]   Ct Head Wo Contrast  Result Date: Dec 16, 2017 CLINICAL DATA:  Cardiac arrest. EXAM: CT HEAD WITHOUT CONTRAST TECHNIQUE: Contiguous axial images were obtained from the base of the skull through the vertex without intravenous contrast. COMPARISON:  None. FINDINGS: Brain: There is symmetric edema throughout the basal ganglia bilaterally. There is decreased cortical gray-white differentiation more conspicuous in the right than the left cerebral hemispheres. There is no midline shift or tonsillar herniation. No intracranial hemorrhage or extra-axial fluid collection is identified. The ventricles are small. Vascular: Calcified atherosclerosis at the skull base. No hyperdense vessel. Skull: No fracture or focal osseous lesion. Sinuses/Orbits: Fluid in the  paranasal sinuses, nasal cavity, and pharynx likely related intubation. Clear mastoid air cells. Bilateral cataract extraction. Other: None. IMPRESSION: 1. Cerebral edema consistent with anoxic injury.  No herniation. 2. No intracranial hemorrhage. Electronically Signed   By: Logan Bores M.D.   On: 12/16/17 01:38   Dg Chest Portable 1 View  Result Date: 2017-12-16 CLINICAL DATA:  ETT adjustment EXAM: PORTABLE CHEST 1 VIEW COMPARISON:  11/26/2017 FINDINGS: Endotracheal tube tip is about 3 cm superior to the carina. Esophageal tube tip is below the diaphragm. Mild ground-glass opacity in the right upper lobe. Stable cardiomediastinal silhouette. Aortic atherosclerosis. No pneumothorax. IMPRESSION: 1. Endotracheal tube tip about 3 cm superior to carina 2. No change in mild ground-glass infiltrate in the right upper lobe Electronically Signed   By: Donavan Foil M.D.   On: 12/16/17 00:11   Dg Chest Portable 1 View  Result Date: December 16, 2017 CLINICAL DATA:  Cardiac arrest EXAM: PORTABLE CHEST 1 VIEW COMPARISON:  None. FINDINGS: Endotracheal tube tip is near the carina. Esophageal tube tip is below the diaphragm but non included. Normal heart size. Mild ground-glass opacity in the right upper lobe. Aortic atherosclerosis. No pneumothorax. Right shoulder calcific tendinitis. IMPRESSION: 1. Endotracheal tube tip at the carina. 2. Mild ground-glass opacity in the right upper lobe, possible pneumonia Electronically Signed   By: Donavan Foil M.D.   On: 12-16-17 00:10   Dg Abd Portable 1 View  Result Date: December 16, 2017 CLINICAL DATA:  OG tube placement EXAM: PORTABLE ABDOMEN - 1 VIEW COMPARISON:  None. FINDINGS: Lung bases are clear. Esophageal tube tip overlies the gastric body. Multiple loops of borderline enlarged small bowel up to 3 cm. IMPRESSION: 1. Esophageal tube tip overlies the gastric body 2. Multiple air-filled mildly distended small bowel loops could relate to an ileus versus partial obstruction  Electronically Signed   By: Donavan Foil M.D.   On: 2017/12/16 00:10    Review of Systems  Unable to perform ROS: Critical illness    Blood pressure 96/69, pulse 63, temperature (!) 93.2 F (34 C), resp. rate 17, height _0  (1.626 m), weight 73.8 kg, SpO2 100 %. Physical Exam  Vitals reviewed. Constitutional: She appears well-developed and well-nourished. She appears distressed. She is intubated.  HENT:  Head: Normocephalic and atraumatic.  Mouth/Throat: Oropharynx is clear and moist.  Eyes: Pupils are equal, round, and reactive to light. Conjunctivae and EOM are normal. No scleral icterus.  Neck: Normal range of motion. Neck supple. No tracheal deviation present. No thyromegaly present.  Cardiovascular: Normal rate, regular rhythm and normal heart sounds. Exam reveals no gallop and no friction rub.  No murmur heard. Respiratory: [place] normal and breath sounds normal. She is intubated.  GI: Soft. Bowel sounds are normal. She exhibits no distension. There is no tenderness.  Genitourinary:  Genitourinary Comments: Deferred  Musculoskeletal: She  exhibits no edema.  Patient is sedated cannot participate in muscular skeletal exam  Lymphadenopathy:    She has no cervical adenopathy.  Neurological: She exhibits normal muscle tone.  Sedated and intubated  Skin: Skin is warm and dry. No rash noted. No erythema.  Psychiatric:  Patient is sedated and intubated     Assessment/Plan This is a 84 year old female admitted for cardiac arrest. 1.  Cardiac arrest: V. tach; stable on amiodarone this time.  Norepinephrine drip started since admission.  Continue antiarrhythmic therapy.  Monitor telemetry.  Consult cardiology. 2.  Respiratory failure: Acute; with hypoxemia.  Patient is intubated on mechanical ventilation.  Supplement oxygen as needed. 3.  Sepsis: Elevated lactic acid, hypothermia and leukocytosis.  The patient is on cefepime and vancomycin.  Follow blood cultures for growth and  sensitivities. 4.  Pneumonia: Community-acquired; likely source of sepsis.  Infiltrate in right upper lobe well-defined likely present prior to cardiac event. 5.  DVT prophylaxis: Heparin 6.  GI prophylaxis: Pepcid The patient is a full code.  Time spent on admission orders and critical care approximately 45 minutes  Harrie Foreman, MD 12-Dec-2017, 5:25 AM

## 2017-12-17 NOTE — Consult Note (Addendum)
Name: Belinda Walsh MRN: [id number] DOB: 1932-12-19    ADMISSION DATE:  11/30/2017 CONSULTATION DATE: 2017-12-05  REFERRING MD : Dr. Marcille Blanco   CHIEF COMPLAINT: Cardiac Arrest   BRIEF PATIENT DESCRIPTION:  84 yo female admitted post cardiac arrest mechanically intubated CT head revealed cerebral edema consistent with anoxic injury and partial obstruction vs. ileus   SIGNIFICANT EVENTS/STUDIES:  11/4-Pt admitted to ICU  11/5-CT Head revealed cerebral edema consistent with anoxic injury.  No herniation. No intracranial hemorrhage  HISTORY OF PRESENT ILLNESS:   This is an 84 yo female with a PMH of MI, HTN, CAD, Left Breast Cancer, and Anemia.  She presented to [facility] ER on 11/5 via EMS s/p cardiac arrest.  Per ER notes EMS was originally called out to pts home for respiratory distress.  According to pts daughter she was in her usual state of health the morning of 11/4 working in a garden, and developed respiratory distress that afternoon notifying her daughter via telephone. When the pts daughter arrived at pts home the pt was initially responsive and took a breathing treatment then became unresponsive. When EMS arrived she was found to be in asystole with several EKG changes en route to the ER.  EMS administered 8 rounds of epinephrine and 1 amp of bicarb prior to [place] airway placed downtime unknown. Upon arrival to the ER pt mechanically intubated by ER provider.  Lab results revealed K+ 5.1, CO2 18, serum glucose 377, creatinine 1.86, calcium 8.6, troponin 0.10, wbc 14.4, hgb 10.6, and influenza pcr negative.  Pt hypothermic temp 95.2 and hypotensive code sepsis initiated she received iv abx and 2,250 ml NS bolus.  Initial EKG revealed sinus arrhythmia with BBB, however pt had multiple EKG changes including runs of Vtach requiring amiodarone gtt.  CXR revealed infiltrate in right upper lobe and KUB showed multiple air-filled mildly distended small bowel loops could relate to an ileus  versus partial obstruction.  CT Head revealed cerebral edema consistent with anoxic injury, no herniation present.  She was subsequently admitted to ICU by hospitalist team for additional workup and treatment.   PAST MEDICAL HISTORY :   has a past medical history of Anemia, Breast cancer ([place]) (2011), Cancer [facility]) (2011), Coronary artery disease, Hypertension, and Myocardial infarction ([place]) (2014).  has a past surgical history that includes Breast biopsy (Left, 2011); Breast excisional biopsy (Left, 2011); Coronary angioplasty (2014); Colonoscopy; Foot surgery (Bilateral, 1984); Cataract extraction w/PHACO (Left, 04/11/2017); and Cataract extraction w/PHACO (Right, 05/09/2017). Prior to Admission medications   Medication Sig Start Date End Date Taking? Authorizing Provider  albuterol (PROVENTIL) (2.5 MG/3ML) 0.083% nebulizer solution Inhale 2.5 mg into the lungs every 6 (six) hours as needed for wheezing. 09/26/17  Yes [provider]  amLODipine (NORVASC) 10 MG tablet Take 10 mg by mouth daily. 09/04/17  Yes [provider]  aspirin EC 81 MG tablet Take 81 mg by mouth daily.    Yes [provider]  fluticasone-salmeterol (ADVAIR HFA) 115-21 MCG/ACT inhaler Inhale 2 puffs into the lungs 2 (two) times daily.   Yes [provider]  metoprolol tartrate (LOPRESSOR) 25 MG tablet Take 25 mg by mouth 2 (two) times daily. 09/04/17  Yes [provider]  pravastatin (PRAVACHOL) 80 MG tablet Take 80 mg by mouth at bedtime. 09/17/17  Yes [provider]   Allergies  Allergen Reactions  . Lisinopril Cough  . Oxycodone Itching    Increase HR per patient  . Losartan Rash  FAMILY HISTORY:  family history includes Breast cancer in her cousin; Breast cancer (age of onset: 37) in her sister. SOCIAL HISTORY:  reports that she has never smoked. She has never used smokeless tobacco.  REVIEW OF SYSTEMS:   Unable to assess pt mechanically intubated    SUBJECTIVE:  Unable to assess pt mechanically intubated  VITAL SIGNS: Temp:  [94 F (34.4 C)-95.2 F (35.1 C)] 94 F (34.4 C) (11/05 0140) Pulse Rate:  [63-74] 63 (11/05 0234) Resp:  [18-20] 18 (11/05 0234) BP: (88-135)/(59-84) 93/61 (11/05 0234) SpO2:  [98 %-100 %] 100 % (11/05 0234) FiO2 (%):  [50 %] 50 % (11/04 2330) Weight:  [73.8 kg] 73.8 kg (11/05 0021)  PHYSICAL EXAMINATION: General: acutely ill appearing female, NAD mechanically intubated Neuro: sedated not following commands, 2 mm bilateral pupils very sluggish, sclera edema present  HEENT: supple, no JVD  Cardiovascular: sinus rhythm, no R/G  Lungs: rhonchi throughout, even, non labored  Abdomen: +BS x4, obese, soft, abdominal distension present  Musculoskeletal: normal bulk and tone, no edema  Skin: intact no rashes or lesions   Recent Labs  Lab 12/13/2017 2332  NA 141  K 5.1  CL 108  CO2 18*  BUN 22  CREATININE 1.86*  GLUCOSE 377*   Recent Labs  Lab 11/19/2017 2332  HGB 10.6*  HCT 35.1*  WBC 14.4*  PLT 92*   Ct Head Wo Contrast  Result Date: 2017-11-29 CLINICAL DATA:  Cardiac arrest. EXAM: CT HEAD WITHOUT CONTRAST TECHNIQUE: Contiguous axial images were obtained from the base of the skull through the vertex without intravenous contrast. COMPARISON:  None. FINDINGS: Brain: There is symmetric edema throughout the basal ganglia bilaterally. There is decreased cortical gray-white differentiation more conspicuous in the right than the left cerebral hemispheres. There is no midline shift or tonsillar herniation. No intracranial hemorrhage or extra-axial fluid collection is identified. The ventricles are small. Vascular: Calcified atherosclerosis at the skull base. No hyperdense vessel. Skull: No fracture or focal osseous lesion. Sinuses/Orbits: Fluid in the paranasal sinuses, nasal cavity, and pharynx likely related intubation. Clear mastoid air cells. Bilateral cataract extraction. Other: None. IMPRESSION: 1.  Cerebral edema consistent with anoxic injury.  No herniation. 2. No intracranial hemorrhage. Electronically Signed   By: Logan Bores M.D.   On: November 29, 2017 01:38   Dg Chest Portable 1 View  Result Date: 2017-11-29 CLINICAL DATA:  ETT adjustment EXAM: PORTABLE CHEST 1 VIEW COMPARISON:  12/08/2017 FINDINGS: Endotracheal tube tip is about 3 cm superior to the carina. Esophageal tube tip is below the diaphragm. Mild ground-glass opacity in the right upper lobe. Stable cardiomediastinal silhouette. Aortic atherosclerosis. No pneumothorax. IMPRESSION: 1. Endotracheal tube tip about 3 cm superior to carina 2. No change in mild ground-glass infiltrate in the right upper lobe Electronically Signed   By: Donavan Foil M.D.   On: 2017-11-29 00:11   Dg Chest Portable 1 View  Result Date: 11/29/2017 CLINICAL DATA:  Cardiac arrest EXAM: PORTABLE CHEST 1 VIEW COMPARISON:  None. FINDINGS: Endotracheal tube tip is near the carina. Esophageal tube tip is below the diaphragm but non included. Normal heart size. Mild ground-glass opacity in the right upper lobe. Aortic atherosclerosis. No pneumothorax. Right shoulder calcific tendinitis. IMPRESSION: 1. Endotracheal tube tip at the carina. 2. Mild ground-glass opacity in the right upper lobe, possible pneumonia Electronically Signed   By: Donavan Foil M.D.   On: 11/29/2017 00:10   Dg Abd Portable 1 View  Result Date: November 29, 2017 CLINICAL DATA:  OG tube placement  EXAM: PORTABLE ABDOMEN - 1 VIEW COMPARISON:  None. FINDINGS: Lung bases are clear. Esophageal tube tip overlies the gastric body. Multiple loops of borderline enlarged small bowel up to 3 cm. IMPRESSION: 1. Esophageal tube tip overlies the gastric body 2. Multiple air-filled mildly distended small bowel loops could relate to an ileus versus partial obstruction Electronically Signed   By: Donavan Foil M.D.   On: Dec 11, 2017 00:10    ASSESSMENT / PLAN:  Acute respiratory failure secondary to RUL pneumonia    Mechanical intubation s/p cardiac arrest  Full vent support-vent settings reviewed and established  SBT once all parameters met Scheduled and prn bronchodilator therapy VAP bundle implemented  Continue cefepime and vancomycin for now if MRSA PCR negative will d/c vancomycin  Trend WBC and monitor fever curve  Trend PCT and lactic acid Follow cultures  Will check ABG Will keep pt hypothermic @36  degrees C   Cardiac Arrest of unknown etiology may be secondary to respiratory arrest  Ventricular Tachycardia  Elevated troponin s/p cardiac arrest  Septic shock secondary to RUL pneumonia  Hx: MI, HTN, and CAD  Continuous telemetry monitoring  Continue aspirin  Trend troponin's  Echo pending  Prn levophed gtt to maintain map >65 Hold outpatient antihypertensives  Continue amiodarone gtt   Acute renal failure likely secondary to hypotension  Lactic acidosis  Trend BMP Replace electrolytes as indicated  Monitor UOP Avoid nephrotoxic medications  Trend lactic acid   Anemia without obvious acute blood loss  Thrombocytopenia  VTE px: subq heparin  Trend CBC  Monitor for s/sx of bleeding and transfuse for hgb <7  Ileus vs. partial bowel obstruction  Keep NPO for now  OG tube to LIS  Repeat KUB  SUP px: iv pepcid   Hyperglycemia  CBG's q4hrs  SSI   Acute encephalopathy secondary to cerebral edema consistent with anoxic injury post cardiac arrest  Maintain RASS goal 0 to -1 Prn propofol gtt to maintain RASS goal  WUA daily EEG pending  Neurology consulted appreciate input   -Will consult palliative care due to CT Head findings to discuss goals of treatment.  Updated pts family regarding CT Head results and plan of care all questions answered  Marda Stalker, [place] Pager [phone] (please enter 7 digits) PCCM Consult Pager [phone] (please enter 7 digits)

## 2017-12-17 NOTE — ED Notes (Signed)
Dr Owens Shark notified of pt's elevated Troponin level as reported by lab at this time.

## 2017-12-17 NOTE — Progress Notes (Signed)
CODE SEPSIS - PHARMACY COMMUNICATION  **Broad Spectrum Antibiotics should be administered within 1 hour of Sepsis diagnosis**  Time Code Sepsis Called/Page Received: 0133  Antibiotics Ordered: vanc/meropenem  Time of 1st antibiotic administration: 0040  Additional action taken by pharmacy:   If necessary, Name of Provider/Nurse Contacted:     Tobie Lords ,PharmD Clinical Pharmacist  12/02/2017  1:54 AM

## 2017-12-17 NOTE — Progress Notes (Signed)
[place] at [facility]                                                                                                                                                                                  Patient Demographics   Belinda Walsh, is a 84 y.o. female, DOB - 1932-03-21, VOJ:[id number]  Admit date - 11/18/2017   Admitting Physician Harrie Foreman, MD  Outpatient Primary MD for the patient is Ezequiel Kayser, MD   LOS - 0  Subjective: Patient intubated admitted with cardiac arrest    Review of Systems:   CONSTITUTIONAL: Sedated intubated  Vitals:   Vitals:   Dec 12, 2017 1000 12-12-17 1100 12/12/2017 1200 12-12-2017 1300  BP: (!) 71/38 (!) 87/57 (!) 86/60   Pulse: (!) 48 (!) 41 (!) 40 (!) 40  Resp: (!) 22 (!) 22 (!) 22 (!) 22  Temp: (!) 92.8 F (33.8 C) (!) 92.5 F (33.6 C) (!) 92.1 F (33.4 C) (!) 92 F (33.3 C)  TempSrc:   Bladder   SpO2: 99% 97% 96% 95%  Weight:      Height:        Wt Readings from Last 3 Encounters:  Dec 12, 2017 73.8 kg  05/09/17 65.8 kg  04/11/17 66.7 kg     Intake/Output Summary (Last 24 hours) at 12/12/17 1406 Last data filed at Dec 12, 2017 0900 Gross per 24 hour  Intake 3008.19 ml  Output 150 ml  Net 2858.19 ml    Physical Exam:   GENERAL: Critically ill HEAD, EYES, EARS, NOSE AND THROAT: Atraumatic, normocephalic.. Pupils equal and reactive to light. Sclerae anicteric. No conjunctival injection. No oro-pharyngeal erythema.  NECK: Supple. There is no jugular venous distention. No bruits, no lymphadenopathy, no thyromegaly.  HEART: Regular rate and rhythm,. No murmurs, no rubs, no clicks.  LUNGS: Clear to auscultation bilaterally. No rales or rhonchi. No wheezes.  ABDOMEN: Soft, flat, nontender, nondistended. Has good bowel sounds. No hepatosplenomegaly appreciated.  EXTREMITIES: No evidence of any cyanosis, clubbing, or peripheral edema.  +2 pedal and radial pulses bilaterally.  NEUROLOGIC:  Unresponsive SKIN: Moist and warm with no rashes appreciated.  Psych: Not anxious, depressed LN: No inguinal LN enlargement    Antibiotics   Anti-infectives (From admission, onward)   Start     Dose/Rate Route Frequency Ordered Stop   11/22/17 0100  vancomycin (VANCOCIN) IVPB 1000 mg/200 mL premix  Status:  Discontinued     1,000 mg 200 mL/hr over 60 Minutes Intravenous Every 24 hours 12-Dec-2017 1048 12/12/17 1349   12-12-17 2300  ceFEPIme (MAXIPIME) 1 g in sodium chloride 0.9 % 100 mL IVPB  Status:  Discontinued     1  g 200 mL/hr over 30 Minutes Intravenous Every 24 hours 11/28/2017 0541 11-28-2017 1349   11/28/2017 1100  vancomycin (VANCOCIN) IVPB 1000 mg/200 mL premix  Status:  Discontinued     1,000 mg 200 mL/hr over 60 Minutes Intravenous Every 24 hours November 28, 2017 0636 11/28/17 1048   11-28-2017 0045  vancomycin (VANCOCIN) IVPB 1000 mg/200 mL premix     1,000 mg 200 mL/hr over 60 Minutes Intravenous  Once 11/28/2017 0037 11/28/2017 0218   11/28/17 0030  ceFEPIme (MAXIPIME) 1 g in sodium chloride 0.9 % 100 mL IVPB     1 g 200 mL/hr over 30 Minutes Intravenous  Once 2017/11/28 0028 Nov 28, 2017 0113   11-28-2017 0030  vancomycin (VANCOCIN) injection 1 g  Status:  Discontinued     1 g Intravenous  Once 11-28-2017 0028 11-28-2017 0037      Medications   Scheduled Meds: . aspirin EC  81 mg Oral Daily  . chlorhexidine gluconate (MEDLINE KIT)  15 mL Mouth Rinse BID  . [START ON 11/22/2017] famotidine  20 mg Per Tube Daily  . fluticasone furoate-vilanterol  1 puff Inhalation Daily  . heparin  5,000 Units Subcutaneous Q8H  . [START ON 11/22/2017] Influenza vac split quadrivalent PF  0.5 mL Intramuscular Tomorrow-1000  . insulin aspart  0-9 Units Subcutaneous Q4H  . mouth rinse  15 mL Mouth Rinse 10 times per day  . [START ON 11/22/2017] pneumococcal 23 valent vaccine  0.5 mL Intramuscular Tomorrow-1000  . pravastatin  80 mg Oral QHS   Continuous Infusions: . sodium chloride 125 mL/hr at 11/28/17 0534  .  amiodarone 30 mg/hr (11/28/2017 0813)  . norepinephrine (LEVOPHED) Adult infusion 20 mcg/min (11/28/2017 1028)  . propofol (DIPRIVAN) infusion Stopped (11/28/2017 0425)   PRN Meds:.acetaminophen **OR** acetaminophen, albuterol, bisacodyl, fentaNYL (SUBLIMAZE) injection, LORazepam, ondansetron **OR** ondansetron (ZOFRAN) IV   Data Review:   Micro Results Recent Results (from the past 240 hour(s))  Blood Culture (routine x 2)     Status: None (Preliminary result)   Collection Time: November 28, 2017  2:20 AM  Result Value Ref Range Status   Specimen Description BLOOD LEFT FATTY CASTS  Final   Special Requests   Final    BOTTLES DRAWN AEROBIC AND ANAEROBIC Blood Culture adequate volume   Culture   Final    NO GROWTH < 12 HOURS Performed at [facility], [address], [place], [place] [address]    Report Status PENDING  Incomplete  Blood Culture (routine x 2)     Status: None (Preliminary result)   Collection Time: 2017/11/28  3:51 AM  Result Value Ref Range Status   Specimen Description BLOOD RIGHT AC  Final   Special Requests   Final    BOTTLES DRAWN AEROBIC AND ANAEROBIC Blood Culture adequate volume   Culture   Final    NO GROWTH < 12 HOURS Performed at [facility], [address] [place], [place] [address]    Report Status PENDING  Incomplete  MRSA PCR Screening     Status: Abnormal   Collection Time: 2017/11/28  4:17 AM  Result Value Ref Range Status   MRSA by PCR POSITIVE (A) NEGATIVE Final    Comment:        The GeneXpert MRSA Assay (FDA approved for NASAL specimens only), is one component of a comprehensive MRSA colonization surveillance program. It is not intended to diagnose MRSA infection nor to guide or monitor treatment for MRSA infections. RESULT CALLED TO, READ BACK BY AND VERIFIED WITH:  [facility] BUONO AT 0071 2017/12/04 SDR Performed at [facility], [address] [place], [place] [address]   Culture, respiratory (non-expectorated)      Status: None (Preliminary result)   Collection Time: Dec 04, 2017  6:15 AM  Result Value Ref Range Status   Specimen Description   Final    TRACHEAL ASPIRATE Performed at [facility], [address] [place], [place] [address]    Special Requests   Final    NONE Performed at [facility], [address] [place], [place] [address]    Gram Stain   Final    RARE WBC PRESENT, PREDOMINANTLY PMN MODERATE GRAM POSITIVE COCCI FEW GRAM POSITIVE RODS FEW YEAST Performed at [facility], [place] [address] [place], [place] [address]    Culture PENDING  Incomplete   Report Status PENDING  Incomplete    Radiology Reports Dg Abd 1 View  Result Date: Dec 04, 2017 CLINICAL DATA:  Followup abdominal ileus. EXAM: ABDOMEN - 1 VIEW COMPARISON:  11/22/2017 FINDINGS: Persistent air-filled small bowel loops but slightly improved when compared to prior study. There is some air and stool noted in the right colon. No obvious free air. IMPRESSION: Improved bowel gas pattern. Persistent air-filled loops of small bowel but less so than prior study. There is also some air in stool in the right colon. Electronically Signed   By: Marijo Sanes M.D.   On: Dec 04, 2017 12:42   Ct Head Wo Contrast  Result Date: 12-04-2017 CLINICAL DATA:  Cardiac arrest. EXAM: CT HEAD WITHOUT CONTRAST TECHNIQUE: Contiguous axial images were obtained from the base of the skull through the vertex without intravenous contrast. COMPARISON:  None. FINDINGS: Brain: There is symmetric edema throughout the basal ganglia bilaterally. There is decreased cortical gray-white differentiation more conspicuous in the right than the left cerebral hemispheres. There is no midline shift or tonsillar herniation. No intracranial hemorrhage or extra-axial fluid collection is identified. The ventricles are small. Vascular: Calcified atherosclerosis at the skull base. No hyperdense vessel. Skull: No fracture or focal osseous lesion.  Sinuses/Orbits: Fluid in the paranasal sinuses, nasal cavity, and pharynx likely related intubation. Clear mastoid air cells. Bilateral cataract extraction. Other: None. IMPRESSION: 1. Cerebral edema consistent with anoxic injury.  No herniation. 2. No intracranial hemorrhage. Electronically Signed   By: Logan Bores M.D.   On: 04-Dec-2017 01:38   Dg Chest Portable 1 View  Result Date: 12-04-17 CLINICAL DATA:  ETT adjustment EXAM: PORTABLE CHEST 1 VIEW COMPARISON:  11/18/2017 FINDINGS: Endotracheal tube tip is about 3 cm superior to the carina. Esophageal tube tip is below the diaphragm. Mild ground-glass opacity in the right upper lobe. Stable cardiomediastinal silhouette. Aortic atherosclerosis. No pneumothorax. IMPRESSION: 1. Endotracheal tube tip about 3 cm superior to carina 2. No change in mild ground-glass infiltrate in the right upper lobe Electronically Signed   By: Donavan Foil M.D.   On: 2017-12-04 00:11   Dg Chest Portable 1 View  Result Date: 2017/12/04 CLINICAL DATA:  Cardiac arrest EXAM: PORTABLE CHEST 1 VIEW COMPARISON:  None. FINDINGS: Endotracheal tube tip is near the carina. Esophageal tube tip is below the diaphragm but non included. Normal heart size. Mild ground-glass opacity in the right upper lobe. Aortic atherosclerosis. No pneumothorax. Right shoulder calcific tendinitis. IMPRESSION: 1. Endotracheal tube tip at the carina. 2. Mild ground-glass opacity in the right upper lobe, possible pneumonia Electronically Signed   By: Donavan Foil M.D.   On: December 04, 2017 00:10   Dg Abd Portable 1 View  Result Date: 12-03-17 CLINICAL DATA:  OG tube placement EXAM: PORTABLE ABDOMEN - 1 VIEW COMPARISON:  None. FINDINGS: Lung bases are clear. Esophageal tube tip overlies the gastric body. Multiple loops of borderline enlarged small bowel up to 3 cm. IMPRESSION: 1. Esophageal tube tip overlies the gastric body 2. Multiple air-filled mildly distended small bowel loops could relate to an ileus  versus partial obstruction Electronically Signed   By: Donavan Foil M.D.   On: 12/03/2017 00:10     CBC Recent Labs  Lab 12/13/2017 2332 12/03/17 0351  WBC 14.4* 15.1*  HGB 10.6* 10.4*  HCT 35.1* 35.2*  PLT 92* 116*  MCV 91.4 92.1  MCH 27.6 27.2  MCHC 30.2 29.5*  RDW 15.1 15.5  LYMPHSABS  --  2.3  MONOABS  --  0.3  EOSABS  --  0.1  BASOSABS  --  0.1    Chemistries  Recent Labs  Lab 12/06/2017 2332 12/03/2017 0447 Dec 03, 2017 0625  NA 141 140  --   K 5.1 3.4*  --   CL 108 112*  --   CO2 18* 16*  --   GLUCOSE 377* 378*  --   BUN 22 25*  --   CREATININE 1.86* 1.78*  --   CALCIUM 8.6* 8.6*  --   MG  --   --  2.3  AST  --  285*  --   ALT  --  154*  --   ALKPHOS  --  105  --   BILITOT  --  0.7  --    ------------------------------------------------------------------------------------------------------------------ estimated creatinine clearance is 24 mL/min (A) (by C-G formula based on SCr of 1.78 mg/dL (H)). ------------------------------------------------------------------------------------------------------------------ No results for input(s): HGBA1C in the last 72 hours. ------------------------------------------------------------------------------------------------------------------ Recent Labs    12/03/2017 0028  TRIG 95   ------------------------------------------------------------------------------------------------------------------ Recent Labs    12-03-2017 0028  TSH 24.434*   ------------------------------------------------------------------------------------------------------------------ No results for input(s): VITAMINB12, FOLATE, FERRITIN, TIBC, IRON, RETICCTPCT in the last 72 hours.  Coagulation profile No results for input(s): INR, PROTIME in the last 168 hours.  No results for input(s): DDIMER in the last 72 hours.  Cardiac Enzymes Recent Labs  Lab 12/08/2017 2332 12-03-17 0625  TROPONINI 0.10* 0.50*    ------------------------------------------------------------------------------------------------------------------ Invalid input(s): POCBNP    Assessment & Plan   This is a 84 year old female admitted for cardiac arrest. 1.  Cardiac arrest: V. tach; continue norepinephrine and amiodarone drip, patient was likely down for a while neurologic injury likely high 2.  Respiratory failure: Acute; with hypoxemia.  Patient is intubated on mechanical ventilation.    Continue ventilator support 3.  Sepsis: Elevated lactic acid, hypothermia and leukocytosis continue broad-spectrum antibiotics this is related to pneumonia 4.  Pneumonia: Community-acquired; likely source of sepsis.  Infiltrate in right upper lobe well-defined likely present prior to cardiac event. 5.  DVT prophylaxis: Heparin 6.  GI prophylaxis: Pepcid       Code Status Orders  (From admission, onward)         Start     Ordered   2017/12/03 0922  Do not attempt resuscitation (DNR)  Continuous    Question Answer Comment  In the event of cardiac or respiratory ARREST Do not call a "code blue"   In the event of cardiac or respiratory ARREST Do not perform Intubation, CPR, defibrillation or ACLS   In the event of cardiac or respiratory ARREST Use medication by any route, position, wound care, and other measures to relive pain and suffering. May use oxygen,  suction and manual treatment of airway obstruction as needed for comfort.      12/21/2017 0221        Code Status History    Date Active Date Inactive Code Status Order ID Comments User Context   12-21-2017 0307 12/21/17 0921 Full Code [id number]  Harrie Foreman, MD ED    Advance Directive Documentation     Most Recent Value  Type of Advance Directive  Healthcare Power of Attorney  Pre-existing out of facility DNR order (yellow form or pink MOST form)  -  "MOST" Form in Place?  -           Consults critical   DVT Prophylaxis  Lovenox Lab Results   Component Value Date   PLT 116 (L) 21-Dec-2017     Time Spent in minutes 45 minutes additional critical care time spent greater than 50% of time spent in care coordination and counseling patient regarding the condition and plan of care.   Dustin Flock M.D on December 21, 2017 at 2:06 PM  Between 7am to 6pm - Pager - [phone]  After 6pm go to [email or web address] - Proofreader  [employer]   Office  [phone]

## 2017-12-17 NOTE — Consult Note (Signed)
Consultation Note Date: November 23, 2017   Patient Name: Belinda Walsh  DOB: April 07, 1932  MRN: [id number]  Age / Sex: 84 y.o., female  PCP: Ezequiel Kayser, MD Referring Physician: Dustin Flock, MD  Reason for Consultation: Terminal Care  HPI/Patient Profile: 84 y.o. female  with past medical history of CAD, MI, HTN, anemia, breast cancer admitted on 11/29/2017 s/p cardiac arrest. EMS originally called out to patient's home for respiratory distress and then went unresponsive. EMS found patient to be in asystole, administered 8 rounds of epinephrine and 1 amp bicarb prior to ROSC. Pt hypothermic and hypotensive, code sepsis initiated. Runs of vtach requiring amiodarone gtt. CXR revealed infiltrate in right upper lobe and KUB showed multiple air-filled mildly distended small bowel loops possibly ileus versus partial obstruction. CT head revealed cerebral edema consistent with anoxic injury without herniation. On 11/5 AM, deterioration in clinical status with bradycardia, hypotension, unresponsive pupils, no corneal or gag reflex concerning for anoxic brain injury with possible herniation now. Per PCCM, family requesting DNR code status. Palliative medicine consultation for goals of care.   Clinical Assessment and Goals of Care: Introduced myself to daughter, Anne Ng at bedside. Multiple family and friends present in room and family waiting room. Introduced palliative care.   Anne Ng has spoke with Dr. Jefferson Fuel and Dr. Doy Mince this morning and respectfully tearful with poor prognosis. She shares that her mother was working in the garden yesterday and became short of breath then leading to cardiac arrest. Anne Ng was planning to bring her to her cardiologist appointment today.   Anne Ng understands her mother is dying and has notified family. The patient has 5 children who are local. They are waiting on patient's sisters to  arrive to the hospital before "pulling the plug."   Answered questions regarding diagnoses, interventions, and prognosis. Explained goal to ensure comfort and dignity through this process.   Emotional and spiritual support provided. Prayed with family at bedside. Left PMT contact information for daughter at bedside.    SUMMARY OF RECOMMENDATIONS    DNR per family request. Waiting on more family to arrive before withdraw of care. Family understands diagnoses and poor prognosis.  PMT contact information given. PMT will continue to support family.   Code Status/Advance Care Planning:  DNR  Palliative Prophylaxis:   Aspiration, Delirium Protocol, Frequent Pain Assessment, Oral Care and Turn Reposition  Psycho-social/Spiritual:   Desire for further Chaplaincy support:yes  Additional Recommendations: Caregiving  Support/Resources and Compassionate Wean Education  Prognosis:   Poor prognosis likely will pass quickly once extubated  Discharge Planning: Anticipated Hospital Death      Primary Diagnoses: Present on Admission: **None**   I have reviewed the medical record, interviewed the patient and family, and examined the patient. The following aspects are pertinent.  Past Medical History:  Diagnosis Date  . Anemia   . Breast cancer ([place]) 2011   radiation  . Cancer [facility]) 2011   Left breast  . Coronary artery disease   . Hypertension   . Myocardial infarction [facility]) 2014  stent placement   Social History   Socioeconomic History  . Marital status: Widowed    Spouse name: Not on file  . Number of children: Not on file  . Years of education: Not on file  . Highest education level: Not on file  Occupational History  . Not on file  Social Needs  . Financial resource strain: Not on file  . Food insecurity:    Worry: Not on file    Inability: Not on file  . Transportation needs:    Medical: Not on file    Non-medical: Not on file  Tobacco Use  . Smoking  status: Never Smoker  . Smokeless tobacco: Never Used  Substance and Sexual Activity  . Alcohol use: Not on file  . Drug use: Not on file  . Sexual activity: Not on file  Lifestyle  . Physical activity:    Days per week: Not on file    Minutes per session: Not on file  . Stress: Not on file  Relationships  . Social connections:    Talks on phone: Not on file    Gets together: Not on file    Attends religious service: Not on file    Active member of club or organization: Not on file    Attends meetings of clubs or organizations: Not on file    Relationship status: Not on file  Other Topics Concern  . Not on file  Social History Narrative  . Not on file   Family History  Problem Relation Age of Onset  . Breast cancer Cousin   . Breast cancer Sister 87   Scheduled Meds: . aspirin EC  81 mg Oral Daily  . chlorhexidine gluconate (MEDLINE KIT)  15 mL Mouth Rinse BID  . [START ON 11/22/2017] famotidine  20 mg Per Tube Daily  . fluticasone furoate-vilanterol  1 puff Inhalation Daily  . heparin  5,000 Units Subcutaneous Q8H  . [START ON 11/22/2017] Influenza vac split quadrivalent PF  0.5 mL Intramuscular Tomorrow-1000  . insulin aspart  0-9 Units Subcutaneous Q4H  . mouth rinse  15 mL Mouth Rinse 10 times per day  . [START ON 11/22/2017] pneumococcal 23 valent vaccine  0.5 mL Intramuscular Tomorrow-1000  . pravastatin  80 mg Oral QHS   Continuous Infusions: . sodium chloride 125 mL/hr at December 04, 2017 0534  . amiodarone 30 mg/hr (December 04, 2017 0813)  . ceFEPime (MAXIPIME) IV    . norepinephrine (LEVOPHED) Adult infusion 20 mcg/min (Dec 04, 2017 1028)  . propofol (DIPRIVAN) infusion Stopped (2017-12-04 0425)  . vancomycin     PRN Meds:.acetaminophen **OR** acetaminophen, albuterol, bisacodyl, ondansetron **OR** ondansetron (ZOFRAN) IV Medications Prior to Admission:  Prior to Admission medications   Medication Sig Start Date End Date Taking? Authorizing Provider  albuterol (PROVENTIL) (2.5  MG/3ML) 0.083% nebulizer solution Inhale 2.5 mg into the lungs every 6 (six) hours as needed for wheezing. 09/26/17  Yes [provider]  amLODipine (NORVASC) 10 MG tablet Take 10 mg by mouth daily. 09/04/17  Yes [provider]  aspirin EC 81 MG tablet Take 81 mg by mouth daily.    Yes [provider]  fluticasone-salmeterol (ADVAIR HFA) 115-21 MCG/ACT inhaler Inhale 2 puffs into the lungs 2 (two) times daily.   Yes [provider]  metoprolol tartrate (LOPRESSOR) 25 MG tablet Take 25 mg by mouth 2 (two) times daily. 09/04/17  Yes [provider]  pravastatin (PRAVACHOL) 80 MG tablet Take 80 mg by mouth at bedtime. 09/17/17  Yes [provider]   Allergies  Allergen Reactions  . Lisinopril Cough  . Oxycodone Itching    Increase HR per patient  . Losartan Rash   Review of Systems  Unable to perform ROS: Acuity of condition   Physical Exam  Constitutional: She is intubated.  Eyes: Right pupil is not reactive. Left pupil is not reactive.  Cardiovascular: Bradycardia present.  Pulmonary/Chest: She is intubated. She has decreased breath sounds.  Neurological: She is unresponsive.  Skin: Skin is warm and dry.  Nursing note and vitals reviewed.  Vital Signs: BP (!) 71/38   Pulse (!) 48   Temp (!) 92.8 F (33.8 C)   Resp (!) 22   Ht _0  (1.626 m)   Wt 73.8 kg   SpO2 99%   BMI 27.93 kg/m  Pain Scale: CPOT     SpO2: SpO2: 99 % O2 Device:SpO2: 99 % O2 Flow Rate: .   IO: Intake/output summary:   Intake/Output Summary (Last 24 hours) at 12/01/2017 1035 Last data filed at 12/01/2017 0900 Gross per 24 hour  Intake 3008.19 ml  Output 150 ml  Net 2858.19 ml    LBM:   Baseline Weight: Weight: 73.8 kg Most recent weight: Weight: 73.8 kg     Palliative Assessment/Data: PPS 10%     Time In: 1005 Time Out: 1040 Time Total: 77mn Greater than 50%  of this time was spent counseling and coordinating care related to the above  assessment and plan.  Signed by:  MIhor Dow FNP-C Palliative Medicine Team  Phone: [phone]: [phone]  Please contact Palliative Medicine Team phone at [phone] questions and concerns.  For individual provider: See AShea Evans

## 2017-12-17 NOTE — Progress Notes (Signed)
Patient ID: Belinda Walsh, female   DOB: 03-04-1932, 84 y.o.   MRN: [id number] Pulmonary/critical care  Patient with deterioration in status.  Now bradycardic, hypotensive, unresponsive pupils, no corneal and no gag reflex concerning for anoxic brain injury with possible herniation.  Neurology evaluating patient.  Discussed with family and related all this information.  They wish her to be a DNR.  They are discussing amongst themselves and additional family regarding comfort care.  [name], DO

## 2017-12-17 NOTE — ED Notes (Signed)
Only 1 set of blood cultures collected d/t pt receiving 2 antibiotics prior to Code Sepsis being activated.

## 2017-12-17 NOTE — Progress Notes (Signed)
Patient ID: Belinda Walsh, female   DOB: 04/23/1932, 84 y.o.   MRN: [id number] Pulmonary / Critical Care  Patients family wishes to transition to comfort care. Will place orders in.  [name], DO

## 2017-12-17 NOTE — Progress Notes (Signed)
Nutrition Brief Note  84 year old female with PMHx of breast cancer s/p XRT, CAD, HTN, anemia, hx MI in 2014 s/p PCI who is admitted s/p cardiac arrest, on mechanical ventilation, with concern for severe anoxic brain injury with possible herniation, also with ileus vs. partial obstruction.  Chart reviewed. Patient has been made DNR. Plan will be for withdrawal of care once more family members arrive.  No nutrition interventions warranted at this time. Please consult RD as needed.   Willey Blade, MS, [place], LDN Office: [phone] Pager: [phone] After Hours/Weekend Pager: [phone]

## 2017-12-17 NOTE — ED Provider Notes (Signed)
[facility] Emergency Department Provider Note   First MD Initiated Contact with Patient 11/28/2017 2357     (approximate)  I have reviewed the triage vital signs and the nursing notes.  Level 5 caveat: History and physical exam limited secondary to cardiac arrest. HISTORY  Chief Complaint Cardiac Arrest   HPI Belinda Walsh is a 84 y.o. female below list of chronic medical conditions including previous myocardial infarction with stent placement presents the emergency department via EMS status post cardiac arrest.  Per EMS the call was placed to EMS secondary to difficulty breathing on their arrival patient was pulseless.  EMS states while in route to the emergency department 8 doses of epinephrine was administered with return of of spontaneous circulation multiple times while in route and subsequently lost.  On arrival to the emergency department patient noted to have ventricular tachycardia.  Patient's family members stated that the patient was in her normal state of health without any complaints other than a cough yesterday.  Patient's family member states that she was called by the patient and when she presented to the home patient complained of shortness of breath and was given albuterol inhaler without any improvement.   Past Medical History:  Diagnosis Date  . Anemia   . Breast cancer ([place]) 2011   radiation  . Cancer [facility]) 2011   Left breast  . Coronary artery disease   . Hypertension   . Myocardial infarction [facility]) 2014   stent placement    Patient Active Problem List   Diagnosis Date Noted  . V-tach ([place]) 2017/12/09  . 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 Gout 09/20/2013  . Cutaneous eruption 05/24/2013  . Arteriosclerosis of coronary artery 08/30/2012  . HLD (hyperlipidemia) 08/24/2012  . BP (high blood pressure) 08/24/2012    Past Surgical History:  Procedure Laterality Date  . BREAST BIOPSY Left 2011   core  bx- positive  . BREAST EXCISIONAL BIOPSY Left 2011  . CATARACT EXTRACTION W/PHACO Left 04/11/2017   Procedure: CATARACT EXTRACTION PHACO AND INTRAOCULAR LENS PLACEMENT (IOC) LEFT;  Surgeon: Eulogio Bear, MD;  Location: [place];  Service: Ophthalmology;  Laterality: Left;  . CATARACT EXTRACTION W/PHACO Right 05/09/2017   Procedure: CATARACT EXTRACTION PHACO AND INTRAOCULAR LENS PLACEMENT ([place]) RIGHT;  Surgeon: Eulogio Bear, MD;  Location: [place];  Service: Ophthalmology;  Laterality: Right;  . COLONOSCOPY    . CORONARY ANGIOPLASTY  2014   stent placement  . FOOT SURGERY Bilateral 1984    Prior to Admission medications   Medication Sig Start Date End Date Taking? Authorizing Provider  albuterol (PROVENTIL) (2.5 MG/3ML) 0.083% nebulizer solution Inhale 2.5 mg into the lungs every 6 (six) hours as needed for wheezing. 09/26/17  Yes [provider]  amLODipine (NORVASC) 10 MG tablet Take 10 mg by mouth daily. 09/04/17  Yes [provider]  aspirin EC 81 MG tablet Take 81 mg by mouth daily.    Yes [provider]  fluticasone-salmeterol (ADVAIR HFA) 115-21 MCG/ACT inhaler Inhale 2 puffs into the lungs 2 (two) times daily.   Yes [provider]  metoprolol tartrate (LOPRESSOR) 25 MG tablet Take 25 mg by mouth 2 (two) times daily. 09/04/17  Yes [provider]  pravastatin (PRAVACHOL) 80 MG tablet Take 80 mg by mouth at bedtime. 09/17/17  Yes [provider]    Allergies Lisinopril; Oxycodone; and Losartan  Family History  Problem Relation Age of Onset  .  Breast cancer Cousin   . Breast cancer Sister 59    Social History Social History   Tobacco Use  . Smoking status: Never Smoker  . Smokeless tobacco: Never Used  Substance Use Topics  . Alcohol use: Not on file  . Drug use: Not on file    Review of Systems Constitutional: No fever/chills Eyes: No visual changes. ENT: No sore  throat. Cardiovascular: Denies chest pain. Respiratory: Positive for shortness of breath. Gastrointestinal: No abdominal pain.  No nausea, no vomiting.  No diarrhea.  No constipation. Genitourinary: Negative for dysuria. Musculoskeletal: Negative for neck pain.  Negative for back pain. Integumentary: Negative for rash. Neurological: Negative for headaches, focal weakness or numbness.  ____________________________________________   PHYSICAL EXAM:  VITAL SIGNS: ED Triage Vitals  Enc Vitals Group     BP 12/04/2017 2328 103/72     Pulse Rate 12/08/2017 2328 74     Resp 11/26/2017 2333 18     Temp November 22, 2017 0000 (!) 95.2 F (35.1 C)     Temp Source 22-Nov-2017 0000 Core     SpO2 11/30/2017 2328 100 %     Weight --      Height --      Head Circumference --      Peak Flow --      Pain Score --      Pain Loc --      Pain Edu? --      Excl. in [place]? --     Constitutional: Unresponsive Eyes: Conjunctivae are normal.  Fixed pupils bilaterally Head: Atraumatic.  Emesis noted on the face Mouth/Throat: Mucous membranes are moist. Oropharynx non-erythematous. Neck: No evidence of trauma. Cardiovascular: Normal rate, regular rhythm. Good peripheral circulation. Grossly normal heart sounds. Respiratory: Normal respiratory effort.  No retractions. Lungs CTAB. Gastrointestinal: Soft and nontender. No distention.  Musculoskeletal: No lower extremity tenderness nor edema. No gross deformities of extremities. Neurologic: Unresponsive pupils fixed bilaterally Skin:  Skin is warm, dry and intact. No rash noted. Psychiatric: Mood and affect are normal. Speech and behavior are normal.  ____________________________________________   LABS (all labs ordered are listed, but only abnormal results are displayed)  Labs Reviewed  MRSA PCR SCREENING - Abnormal; Notable for the following components:      Result Value   MRSA by PCR POSITIVE (*)    All other components within normal limits  BASIC METABOLIC  PANEL - Abnormal; Notable for the following components:   CO2 18 (*)    Glucose, Bld 377 (*)    Creatinine, Ser 1.86 (*)    Calcium 8.6 (*)    GFR calc non Af Amer 24 (*)    GFR calc Af Amer 28 (*)    All other components within normal limits  CBC - Abnormal; Notable for the following components:   WBC 14.4 (*)    RBC 3.84 (*)    Hemoglobin 10.6 (*)    HCT 35.1 (*)    Platelets 92 (*)    nRBC 0.3 (*)    All other components within normal limits  TROPONIN I - Abnormal; Notable for the following components:   Troponin I 0.10 (*)    All other components within normal limits  URINALYSIS, ROUTINE W REFLEX MICROSCOPIC - Abnormal; Notable for the following components:   Color, Urine YELLOW (*)    APPearance HAZY (*)    Glucose, UA 50 (*)    Hgb urine dipstick MODERATE (*)    Protein, ur 100 (*)  Bacteria, UA RARE (*)    All other components within normal limits  LACTIC ACID, PLASMA - Abnormal; Notable for the following components:   Lactic Acid, Venous 6.3 (*)    All other components within normal limits  LACTIC ACID, PLASMA - Abnormal; Notable for the following components:   Lactic Acid, Venous 4.6 (*)    All other components within normal limits  TSH - Abnormal; Notable for the following components:   TSH 24.434 (*)    All other components within normal limits  CBC WITH DIFFERENTIAL/PLATELET - Abnormal; Notable for the following components:   WBC 15.1 (*)    RBC 3.82 (*)    Hemoglobin 10.4 (*)    HCT 35.2 (*)    MCHC 29.5 (*)    Platelets 116 (*)    nRBC 0.3 (*)    Neutro Abs 12.0 (*)    Abs Immature Granulocytes 0.36 (*)    All other components within normal limits  BLOOD GAS, ARTERIAL - Abnormal; Notable for the following components:   pCO2 arterial 50 (*)    Bicarbonate 15.9 (*)    Acid-base deficit 13.3 (*)    All other components within normal limits  BLOOD GAS, ARTERIAL - Abnormal; Notable for the following components:   pH, Arterial 7.33 (*)    pO2, Arterial  158 (*)    Acid-base deficit 4.9 (*)    All other components within normal limits  COMPREHENSIVE METABOLIC PANEL - Abnormal; Notable for the following components:   Potassium 3.4 (*)    Chloride 112 (*)    CO2 16 (*)    Glucose, Bld 378 (*)    BUN 25 (*)    Creatinine, Ser 1.78 (*)    Calcium 8.6 (*)    Total Protein 5.8 (*)    Albumin 2.8 (*)    AST 285 (*)    ALT 154 (*)    GFR calc non Af Amer 25 (*)    GFR calc Af Amer 29 (*)    All other components within normal limits  CULTURE, BLOOD (ROUTINE X 2)  CULTURE, BLOOD (ROUTINE X 2)  URINE CULTURE  CULTURE, RESPIRATORY  TRIGLYCERIDES  INFLUENZA PANEL BY PCR (TYPE A & B)  PROCALCITONIN  MAGNESIUM  TROPONIN I  TROPONIN I   ____________________________________________  EKG  ED ECG REPORT I, [place] N Skyah Hannon, the attending physician, personally viewed and interpreted this ECG.   Date: Nov 25, 2017  EKG Time: 11:34 PM  Rate: 68  Rhythm: Sinus arrhythmia  Axis: Normal  Intervals: Short PR interval  ST&T Change: None  ____________________________________________  RADIOLOGY I, Mar[place] N Renate Danh, personally viewed and evaluated these images (plain radiographs) as part of my medical decision making, as well as reviewing the written report by the radiologist.  ED MD interpretation: Cerebral edema consistent with anoxic injury on CT scan of the head  Official radiology report(s): Ct Head Wo Contrast  Result Date: 11/25/2017 CLINICAL DATA:  Cardiac arrest. EXAM: CT HEAD WITHOUT CONTRAST TECHNIQUE: Contiguous axial images were obtained from the base of the skull through the vertex without intravenous contrast. COMPARISON:  None. FINDINGS: Brain: There is symmetric edema throughout the basal ganglia bilaterally. There is decreased cortical gray-white differentiation more conspicuous in the right than the left cerebral hemispheres. There is no midline shift or tonsillar herniation. No intracranial hemorrhage or extra-axial fluid  collection is identified. The ventricles are small. Vascular: Calcified atherosclerosis at the skull base. No hyperdense vessel. Skull: No fracture or focal osseous lesion.  Sinuses/Orbits: Fluid in the paranasal sinuses, nasal cavity, and pharynx likely related intubation. Clear mastoid air cells. Bilateral cataract extraction. Other: None. IMPRESSION: 1. Cerebral edema consistent with anoxic injury.  No herniation. 2. No intracranial hemorrhage. Electronically Signed   By: Logan Bores M.D.   On: 12-03-17 01:38   Dg Chest Portable 1 View  Result Date: 12-03-2017 CLINICAL DATA:  ETT adjustment EXAM: PORTABLE CHEST 1 VIEW COMPARISON:  12/13/2017 FINDINGS: Endotracheal tube tip is about 3 cm superior to the carina. Esophageal tube tip is below the diaphragm. Mild ground-glass opacity in the right upper lobe. Stable cardiomediastinal silhouette. Aortic atherosclerosis. No pneumothorax. IMPRESSION: 1. Endotracheal tube tip about 3 cm superior to carina 2. No change in mild ground-glass infiltrate in the right upper lobe Electronically Signed   By: Donavan Foil M.D.   On: 12/03/2017 00:11   Dg Chest Portable 1 View  Result Date: Dec 03, 2017 CLINICAL DATA:  Cardiac arrest EXAM: PORTABLE CHEST 1 VIEW COMPARISON:  None. FINDINGS: Endotracheal tube tip is near the carina. Esophageal tube tip is below the diaphragm but non included. Normal heart size. Mild ground-glass opacity in the right upper lobe. Aortic atherosclerosis. No pneumothorax. Right shoulder calcific tendinitis. IMPRESSION: 1. Endotracheal tube tip at the carina. 2. Mild ground-glass opacity in the right upper lobe, possible pneumonia Electronically Signed   By: Donavan Foil M.D.   On: Dec 03, 2017 00:10   Dg Abd Portable 1 View  Result Date: 12-03-2017 CLINICAL DATA:  OG tube placement EXAM: PORTABLE ABDOMEN - 1 VIEW COMPARISON:  None. FINDINGS: Lung bases are clear. Esophageal tube tip overlies the gastric body. Multiple loops of borderline  enlarged small bowel up to 3 cm. IMPRESSION: 1. Esophageal tube tip overlies the gastric body 2. Multiple air-filled mildly distended small bowel loops could relate to an ileus versus partial obstruction Electronically Signed   By: Donavan Foil M.D.   On: 12/03/2017 00:10    ____________________________________________   PROCEDURES  Critical Care performed:    Procedure(s) performed:   .Critical Care Performed by: Gregor Hams, MD Authorized by: Gregor Hams, MD   Critical care provider statement:    Critical care time (minutes):  120   Critical care time was exclusive of:  Separately billable procedures and treating other patients   Critical care was time spent personally by me on the following activities:  Development of treatment plan with patient or surrogate, discussions with consultants, evaluation of patient's response to treatment, examination of patient, obtaining history from patient or surrogate, ordering and performing treatments and interventions, ordering and review of laboratory studies, ordering and review of radiographic studies, pulse oximetry, re-evaluation of patient's condition and review of old charts   I assumed direction of critical care for this patient from another provider in my specialty: no   Procedure Name: Intubation Date/Time: Dec 03, 2017 7:05 AM Performed by: Gregor Hams, MD Pre-anesthesia Checklist: Patient identified, Patient being monitored, Emergency Drugs available, Timeout performed and Suction available Oxygen Delivery Method: Non-rebreather mask Preoxygenation: Pre-oxygenation with 100% oxygen Induction Type: Cricoid Pressure applied Ventilation: Mask ventilation without difficulty Laryngoscope Size: 4 and Mac Tube size: 7.5 mm Number of attempts: 1 Placement Confirmation: ETT inserted through vocal cords under direct vision,  CO2 detector and Breath sounds checked- equal and bilateral Secured at: 21 cm Tube secured with: ETT  holder Difficulty Due To: Difficulty was anticipated Comments: Patient did not require rapid sequence intubation drugs for intubation.  Emesis noted in the posterior oropharynx  ____________________________________________   INITIAL IMPRESSION / ASSESSMENT AND PLAN / ED COURSE  As part of my medical decision making, I reviewed the following data within the electronic MEDICAL RECORD NUMBER   84 year old female presented with above-stated history and physical exam with presentation of while in the emergency department on arrival for which patient was given amiodarone 300 mg bolus followed by infusion.  Patient had multiple episodes of ventricular tachycardia while in the emergency department however normal sinus rhythm was obtained and sustained.  Concern for possible aspiration as patient had emesis in the posterior oropharynx.  Chest x-ray however revealed evidence of a right upper lobe pneumonia.  Patient given appropriate antibiotic therapy.  I notified the patient's family multiple times regarding her clinical status.  Patient discussed with Dr. Marcille Blanco for hospital admission for further evaluation and management ____________________________________________  FINAL CLINICAL IMPRESSION(S) / ED DIAGNOSES  Final diagnoses:  Cardiac arrest [facility])  Ventricular tachycardia Right upper lobe pneumonia   MEDICATIONS GIVEN DURING THIS VISIT:  Medications  amiodarone (NEXTERONE PREMIX) 360-4.14 MG/200ML-% (1.8 mg/mL) IV infusion (60 mg/hr Intravenous Transfusing/Transfer Dec 12, 2017 0357)  amiodarone (NEXTERONE PREMIX) 360-4.14 MG/200ML-% (1.8 mg/mL) IV infusion (30 mg/hr Intravenous Rate/Dose Verify 2017-12-12 0534)  propofol (DIPRIVAN) 1000 MG/100ML infusion (0 mcg/kg/min  73.8 kg Intravenous Stopped December 12, 2017 0425)  norepinephrine (LEVOPHED) 90m in D5W 2556mpremix infusion (5 mcg/min Intravenous Rate/Dose Verify 1111/26/2019431)  aspirin EC tablet 81 mg (has no administration in time range)   fluticasone furoate-vilanterol (BREO ELLIPTA) 200-25 MCG/INH 1 puff (has no administration in time range)  pravastatin (PRAVACHOL) tablet 80 mg (has no administration in time range)  albuterol (PROVENTIL) (2.5 MG/3ML) 0.083% nebulizer solution 2.5 mg (has no administration in time range)  acetaminophen (TYLENOL) tablet 650 mg (has no administration in time range)    Or  acetaminophen (TYLENOL) suppository 650 mg (has no administration in time range)  0.9 %  sodium chloride infusion ( Intravenous Rate/Dose Verify 112019/11/26534)  ondansetron (ZOFRAN) tablet 4 mg (has no administration in time range)    Or  ondansetron (ZOFRAN) injection 4 mg (has no administration in time range)  chlorhexidine gluconate (MEDLINE KIT) (PERIDEX) 0.12 % solution 15 mL (has no administration in time range)  MEDLINE mouth rinse (15 mLs Mouth Rinse Given 11November 26, 2019545)  heparin injection 5,000 Units (5,000 Units Subcutaneous Given 11Nov 26, 2019507)  insulin aspart (novoLOG) injection 0-9 Units (5 Units Subcutaneous Given 1111-26-19435)  famotidine (PEPCID) IVPB 20 mg premix ( Intravenous Stopped 1111/26/2019532)  bisacodyl (DULCOLAX) suppository 10 mg (has no administration in time range)  pneumococcal 23 valent vaccine (PNU-IMMUNE) injection 0.5 mL (has no administration in time range)  Influenza vac split quadrivalent PF (FLUZONE HIGH-DOSE) injection 0.5 mL (has no administration in time range)  ceFEPIme (MAXIPIME) 1 g in sodium chloride 0.9 % 100 mL IVPB (has no administration in time range)  sodium chloride 0.9 % bolus 500 mL ( Intravenous New Bag/Given 11Nov 26, 2019616)  potassium chloride 10 mEq in 100 mL IVPB (has no administration in time range)  vancomycin (VANCOCIN) IVPB 1000 mg/200 mL premix (has no administration in time range)  amiodarone (CORDARONE) 300 mg in dextrose 5 % 100 mL bolus (300 mg Intravenous Push 12/10/2017 2335)  atropine injection (1 mg Intravenous Given 12/13/2017 2339)  calcium chloride injection (1 g  Intravenous Given 11/24/2017 2340)  ceFEPIme (MAXIPIME) 1 g in sodium chloride 0.9 % 100 mL IVPB (0 g Intravenous Stopped 1126-Nov-2019113)  vancomycin (VANCOCIN) IVPB 1000 mg/200 mL premix (0 mg Intravenous  Stopped 2017/12/01 0218)  sodium chloride 0.9 % bolus 1,000 mL (0 mLs Intravenous Stopped 2017/12/01 0304)    And  sodium chloride 0.9 % bolus 1,000 mL (0 mLs Intravenous Stopped 2017/12/01 0217)    And  sodium chloride 0.9 % bolus 250 mL (0 mLs Intravenous Stopped 12-01-17 0318)  sodium bicarbonate injection 150 mEq (150 mEq Intravenous Given 12-01-17 0452)     ED Discharge Orders    None       Note:  This document was prepared using Dragon voice recognition software and may include unintentional dictation errors.      Gregor Hams, MD 01-Dec-2017 2219

## 2017-12-17 NOTE — Progress Notes (Addendum)
Pharmacy Antibiotic Note  DEVONE BONILLA is a 84 y.o. female admitted on 12/02/2017 with sepsis.  Pharmacy has been consulted for vanc/cefepime dosing. Patient received vanc 1g IV x 1  Plan: Will continue vanc 1g IV q24h to start @ 1100. Will draw trough 11/8 @ 1000 prior to 4th dose. Will continue cefepime 1g IV q24h   Ke 0.0243 T1/2 ~ 24 hrs Goal trough 15 - 20 mcg/mL  Height: 5\' 4"  (162.6 cm) Weight: 162 lb 11.2 oz (73.8 kg) IBW/kg (Calculated) : 54.7  Temp (24hrs), Avg:94.2 F (34.6 C), Min:93 F (33.9 C), Max:95.2 F (35.1 C)  Recent Labs  Lab 12/11/2017 2332 12-07-17 0220 Dec 07, 2017 0351 12-07-2017 0447  WBC 14.4*  --  15.1*  --   CREATININE 1.86*  --   --  1.78*  LATICACIDVEN  --  6.3* 4.6*  --     Estimated Creatinine Clearance: 24 mL/min (A) (by C-G formula based on SCr of 1.78 mg/dL (H)).    Allergies  Allergen Reactions  . Lisinopril Cough  . Oxycodone Itching    Increase HR per patient  . Losartan Rash   Thank you for allowing pharmacy to be a part of this patient's care.  Tobie Lords, PharmD, BCPS Clinical Pharmacist 12/07/17

## 2017-12-17 NOTE — Consult Note (Signed)
Reason for Consult: Anoxic brain injury Referring Physician: Harrie Foreman, MD  CC: Unresponsiveness  HPI: Belinda Walsh is an 84 y.o. female with pertinent history of coronary artery disease with occluded m LCX s/p BMS to LCX. 80% mRCA, LAD 20-30%, iron deficiency anemia, left breast cancer, hyperlipidemia, hypertension, myocardial infarction, presenting to the ED on 12-18-2017 status post cardiac arrest.  Per ED reports, EMS was called originally to patient's home due to respiratory distress.  Patient's daughter reported that patient was in her usual state of health that morning and was apparently working in her garden when she developed respiratory distress then called her daughter.  Daughter called EMS and when EMS arrived patient was in asystole and unresponsive. CPR and ACLS  Initiated at the scene and patient intubated on route to the ED. Patient received 8 rounds of epinephrine and bicarb with return of spontaneous circulation.  On arrival to the ED patient was mechanically intubated by the ER doctor. Initial labs revealed elevated potassium of 5.1, CO2 18, glucose 377, creatinine of 1.86, calcium of 8.6, elevated troponin 0.10, elevated white count of 14.4, globin 10.6.  Lactic acid this morning 4.6 improved from 6.3. She was noted to be hypothermic with a temp of 95.2 orally and also hypotensive, therefore code sepsis initiated with patient started on empiric antibiotics and given bolus of normal saline.  Patient had further work-up with a CT head which showed diffuse cerebral edema consistent with anoxic brain injury.  Chest x-ray showed infiltrate in the right upper lobe with possible ileus versus partial obstruction, she was started on amiodarone drip and transferred to the ICU for further management.  She is currently intubated and remains unresponsive.  Past Medical History:  Diagnosis Date  . Anemia   . Breast cancer ([place]) 2011   radiation  . Cancer [facility]) 2011   Left breast  .  Coronary artery disease   . Hypertension   . Myocardial infarction [facility]) 2014   stent placement    Past Surgical History:  Procedure Laterality Date  . BREAST BIOPSY Left 2011   core bx- positive  . BREAST EXCISIONAL BIOPSY Left 2011  . CATARACT EXTRACTION W/PHACO Left 04/11/2017   Procedure: CATARACT EXTRACTION PHACO AND INTRAOCULAR LENS PLACEMENT ([facility]) LEFT;  Surgeon: Eulogio Bear, MD;  Location: [place];  Service: Ophthalmology;  Laterality: Left;  . CATARACT EXTRACTION W/PHACO Right 05/09/2017   Procedure: CATARACT EXTRACTION PHACO AND INTRAOCULAR LENS PLACEMENT ([place]) RIGHT;  Surgeon: Eulogio Bear, MD;  Location: [facility];  Service: Ophthalmology;  Laterality: Right;  . COLONOSCOPY    . CORONARY ANGIOPLASTY  2014   stent placement  . FOOT SURGERY Bilateral 1984    Family History  Problem Relation Age of Onset  . Breast cancer Cousin   . Breast cancer Sister 4    Social History:  reports that she has never smoked. She has never used smokeless tobacco. Her alcohol and drug histories are not on file.  Allergies  Allergen Reactions  . Lisinopril Cough  . Oxycodone Itching    Increase HR per patient  . Losartan Rash    Medications:  I have reviewed the patient's current medications. Prior to Admission:  Medications Prior to Admission  Medication Sig Dispense Refill Last Dose  . albuterol (PROVENTIL) (2.5 MG/3ML) 0.083% nebulizer solution Inhale 2.5 mg into the lungs every 6 (six) hours as needed for wheezing.  5 prn at prn  . amLODipine (NORVASC) 10 MG tablet Take 10  mg by mouth daily.  3 unknown at unknown  . aspirin EC 81 MG tablet Take 81 mg by mouth daily.    unknown at unknown  . fluticasone-salmeterol (ADVAIR HFA) 115-21 MCG/ACT inhaler Inhale 2 puffs into the lungs 2 (two) times daily.   unknown at unknown  . metoprolol tartrate (LOPRESSOR) 25 MG tablet Take 25 mg by mouth 2 (two) times daily.  3 unknown at unknown  . pravastatin  (PRAVACHOL) 80 MG tablet Take 80 mg by mouth at bedtime.  3 unknown at unknown   Scheduled: . aspirin EC  81 mg Oral Daily  . chlorhexidine gluconate (MEDLINE KIT)  15 mL Mouth Rinse BID  . fluticasone furoate-vilanterol  1 puff Inhalation Daily  . heparin  5,000 Units Subcutaneous Q8H  . [START ON 11/22/2017] Influenza vac split quadrivalent PF  0.5 mL Intramuscular Tomorrow-1000  . insulin aspart  0-9 Units Subcutaneous Q4H  . mouth rinse  15 mL Mouth Rinse 10 times per day  . [START ON 11/22/2017] pneumococcal 23 valent vaccine  0.5 mL Intramuscular Tomorrow-1000  . pravastatin  80 mg Oral QHS    ROS: Unable to obtained from patient due to unresponsiveness  Physical Examination: Blood pressure 96/69, pulse 63, temperature (!) 93.2 F (34 C), resp. rate 17, height 5' 4"  (1.626 m), weight 73.8 kg, SpO2 100 %.  HEENT-  Normocephalic, no lesions, without obvious abnormality.  Normal external eye and conjunctiva.  Normal TM's bilaterally.  Normal auditory canals and external ears. Normal external nose, mucus membranes and septum.  Normal pharynx. Cardiovascular- S1, S2 normal, pulses palpable throughout   Lungs- chest clear, no wheezing, rales, normal symmetric air entry Abdomen- soft, non-tender; bowel sounds normal; no masses,  no organomegaly Extremities- no edema Lymph-no adenopathy palpable Musculoskeletal-unable to assess Skin-cool and dry, no hyperpigmentation, vitiligo, or suspicious lesions  Mental Status: Unresponsive. Patient does not respond to verbal stimuli.  Does not respond to deep sternal rub.  Does not follow commands.  No verbalizations noted.  Cranial Nerves: II: patient does not respond confrontation bilaterally, pupils  Irregular right 5 mm, left 4 mm,and nonreactive bilaterally III,IV,VI: doll's response absent bilaterallty V,VII: corneal reflex absent bilaterally  VIII: patient does not respond to verbal stimuli IX,X: gag reflex absent, XI: trapezius strength  unable to test bilaterally XII: tongue strength unable to test Motor: Extremities flaccid throughout.  No spontaneous movement noted.  No purposeful movements noted. Sensory: Does not respond to noxious stimuli in any extremity. Deep Tendon Reflexes:  1+ in the upper extremities and absent in the lower extremities Plantars: Mute bilaterally Cerebellar: Unable to perform   Data Reviewed  Laboratory Studies:   Basic Metabolic Panel: Recent Labs  Lab 11/19/2017 2332 11/27/17 0447 Nov 27, 2017 0625  NA 141 140  --   K 5.1 3.4*  --   CL 108 112*  --   CO2 18* 16*  --   GLUCOSE 377* 378*  --   BUN 22 25*  --   CREATININE 1.86* 1.78*  --   CALCIUM 8.6* 8.6*  --   MG  --   --  2.3    Liver Function Tests: Recent Labs  Lab 27-Nov-2017 0447  AST 285*  ALT 154*  ALKPHOS 105  BILITOT 0.7  PROT 5.8*  ALBUMIN 2.8*   No results for input(s): LIPASE, AMYLASE in the last 168 hours. No results for input(s): AMMONIA in the last 168 hours.  CBC: Recent Labs  Lab 12/12/2017 2332 11-27-17 0351  WBC  14.4* 15.1*  NEUTROABS  --  12.0*  HGB 10.6* 10.4*  HCT 35.1* 35.2*  MCV 91.4 92.1  PLT 92* 116*    Cardiac Enzymes: Recent Labs  Lab 11/18/2017 2332 Dec 04, 2017 0625  TROPONINI 0.10* 0.50*    BNP: Invalid input(s): POCBNP  CBG: Recent Labs  Lab 12-04-17 0747  GLUCAP 252*    Microbiology: Results for orders placed or performed during the hospital encounter of 11/24/2017  Blood Culture (routine x 2)     Status: None (Preliminary result)   Collection Time: 12/04/17  2:20 AM  Result Value Ref Range Status   Specimen Description BLOOD LEFT FATTY CASTS  Final   Special Requests   Final    BOTTLES DRAWN AEROBIC AND ANAEROBIC Blood Culture adequate volume   Culture   Final    NO GROWTH < 12 HOURS Performed at [facility], [address] [place], [place] [address]    Report Status PENDING  Incomplete  Blood Culture (routine x 2)     Status: None (Preliminary result)    Collection Time: 2017-12-04  3:51 AM  Result Value Ref Range Status   Specimen Description BLOOD RIGHT AC  Final   Special Requests   Final    BOTTLES DRAWN AEROBIC AND ANAEROBIC Blood Culture adequate volume   Culture   Final    NO GROWTH < 12 HOURS Performed at [facility], [address] [place], [place] [address]    Report Status PENDING  Incomplete  MRSA PCR Screening     Status: Abnormal   Collection Time: 2017/12/04  4:17 AM  Result Value Ref Range Status   MRSA by PCR POSITIVE (A) NEGATIVE Final    Comment:        The GeneXpert MRSA Assay (FDA approved for NASAL specimens only), is one component of a comprehensive MRSA colonization surveillance program. It is not intended to diagnose MRSA infection nor to guide or monitor treatment for MRSA infections. RESULT CALLED TO, READ BACK BY AND VERIFIED WITH:  Laurin Coder AT 0559 12-04-17 SDR Performed at [facility], [address] [place], [place] [address]     Coagulation Studies: No results for input(s): LABPROT, INR in the last 72 hours.  Urinalysis:  Recent Labs  Lab 12/08/2017 2321  COLORURINE YELLOW*  LABSPEC 1.023  PHURINE 5.0  GLUCOSEU 50*  HGBUR MODERATE*  BILIRUBINUR NEGATIVE  KETONESUR NEGATIVE  PROTEINUR 100*  NITRITE NEGATIVE  LEUKOCYTESUR NEGATIVE    Lipid Panel:     Component Value Date/Time   TRIG 95 Dec 04, 2017 0028    HgbA1C: No results found for: HGBA1C  Urine Drug Screen:  No results found for: LABOPIA, COCAINSCRNUR, LABBENZ, AMPHETMU, THCU, LABBARB  Alcohol Level: No results for input(s): ETH in the last 168 hours.  Other results: EKG: junctional rhythm at 64 bpm.  Imaging: Ct Head Wo Contrast  Result Date: 2017/12/04 CLINICAL DATA:  Cardiac arrest. EXAM: CT HEAD WITHOUT CONTRAST TECHNIQUE: Contiguous axial images were obtained from the base of the skull through the vertex without intravenous contrast. COMPARISON:  None. FINDINGS: Brain: There is symmetric  edema throughout the basal ganglia bilaterally. There is decreased cortical gray-white differentiation more conspicuous in the right than the left cerebral hemispheres. There is no midline shift or tonsillar herniation. No intracranial hemorrhage or extra-axial fluid collection is identified. The ventricles are small. Vascular: Calcified atherosclerosis at the skull base. No hyperdense vessel. Skull: No fracture or focal osseous lesion. Sinuses/Orbits: Fluid in the paranasal sinuses, nasal  cavity, and pharynx likely related intubation. Clear mastoid air cells. Bilateral cataract extraction. Other: None. IMPRESSION: 1. Cerebral edema consistent with anoxic injury.  No herniation. 2. No intracranial hemorrhage. Electronically Signed   By: Logan Bores M.D.   On: December 02, 2017 01:38   Dg Chest Portable 1 View  Result Date: 2017-12-02 CLINICAL DATA:  ETT adjustment EXAM: PORTABLE CHEST 1 VIEW COMPARISON:  12/01/2017 FINDINGS: Endotracheal tube tip is about 3 cm superior to the carina. Esophageal tube tip is below the diaphragm. Mild ground-glass opacity in the right upper lobe. Stable cardiomediastinal silhouette. Aortic atherosclerosis. No pneumothorax. IMPRESSION: 1. Endotracheal tube tip about 3 cm superior to carina 2. No change in mild ground-glass infiltrate in the right upper lobe Electronically Signed   By: Donavan Foil M.D.   On: 12/02/2017 00:11   Dg Chest Portable 1 View  Result Date: 12-02-17 CLINICAL DATA:  Cardiac arrest EXAM: PORTABLE CHEST 1 VIEW COMPARISON:  None. FINDINGS: Endotracheal tube tip is near the carina. Esophageal tube tip is below the diaphragm but non included. Normal heart size. Mild ground-glass opacity in the right upper lobe. Aortic atherosclerosis. No pneumothorax. Right shoulder calcific tendinitis. IMPRESSION: 1. Endotracheal tube tip at the carina. 2. Mild ground-glass opacity in the right upper lobe, possible pneumonia Electronically Signed   By: Donavan Foil M.D.   On:  2017/12/02 00:10   Dg Abd Portable 1 View  Result Date: December 02, 2017 CLINICAL DATA:  OG tube placement EXAM: PORTABLE ABDOMEN - 1 VIEW COMPARISON:  None. FINDINGS: Lung bases are clear. Esophageal tube tip overlies the gastric body. Multiple loops of borderline enlarged small bowel up to 3 cm. IMPRESSION: 1. Esophageal tube tip overlies the gastric body 2. Multiple air-filled mildly distended small bowel loops could relate to an ileus versus partial obstruction Electronically Signed   By: Donavan Foil M.D.   On: 2017-12-02 00:10    This patient was staffed with Dr. Magda Paganini, [place] Mince who personally evaluated patient, reviewed documentation and agreed with assessment and plan of care as above.  Rufina Falco, DNP, FNP-BC Board certified Nurse Practitioner Neurology Department  2017-12-02, 8:52 AM   Assessment: 84 year old female s/p arrest.  Now intubated.  Sedation stopped at 0425 this morning.  Patient remains unresponsive.  Overnight worsened and per neurological  Examination patient fulfills criteria for brain death, excluding her respiratory evaluation.  Patient hypothermic as well which also precludes determination of brain death.  Head CT reviewed though and unfortunately  Initially is already showing signs of cerebral edema consistent with anoxic brain injury.  Conversations had with family who now wish for comfort care.  This is appropriate in this setting where prognosis for functional recovery is very poor.    Plan: No further neurologic intervention is recommended at this time.  If further questions arise, please call or page at that time.  Thank you for allowing neurology to participate in the care of this patient.  Case discussed with Dr. Paticia Stack, MD Neurology [phone] 12-02-2017  1:23 PM

## 2017-12-17 NOTE — Progress Notes (Signed)
eLink Physician-Brief Progress Note Patient Name: Belinda Walsh DOB: [birth year] MRN: [id number]   Date of Service  12/14/2017  HPI/Events of Note  84 yo female with a PMH of MI, HTN, CAD, Left Breast Cancer, and Anemia.  She presented to [facility] ER on 11/5 via EMS s/p cardiac arrest. Now intubated and mechanically ventilated. PCCM asked to assume care in ICU.  VSS.  eICU Interventions  No new orders.      Intervention Category Evaluation Type: New Patient Evaluation  Lysle Dingwall Dec 14, 2017, 4:06 AM

## 2017-12-17 DEATH — deceased

## 2020-06-07 IMAGING — DX DG ABDOMEN 1V
1 series · 2 of 2 positions shown · non-contrast
Comparison: 11/20/2017

CLINICAL DATA: Followup abdominal ileus.

EXAM:
ABDOMEN - 1 VIEW

[Series 1: abdomen supine · 0.14mm/px · 2 of 2 slices shown]
[im 1/2]
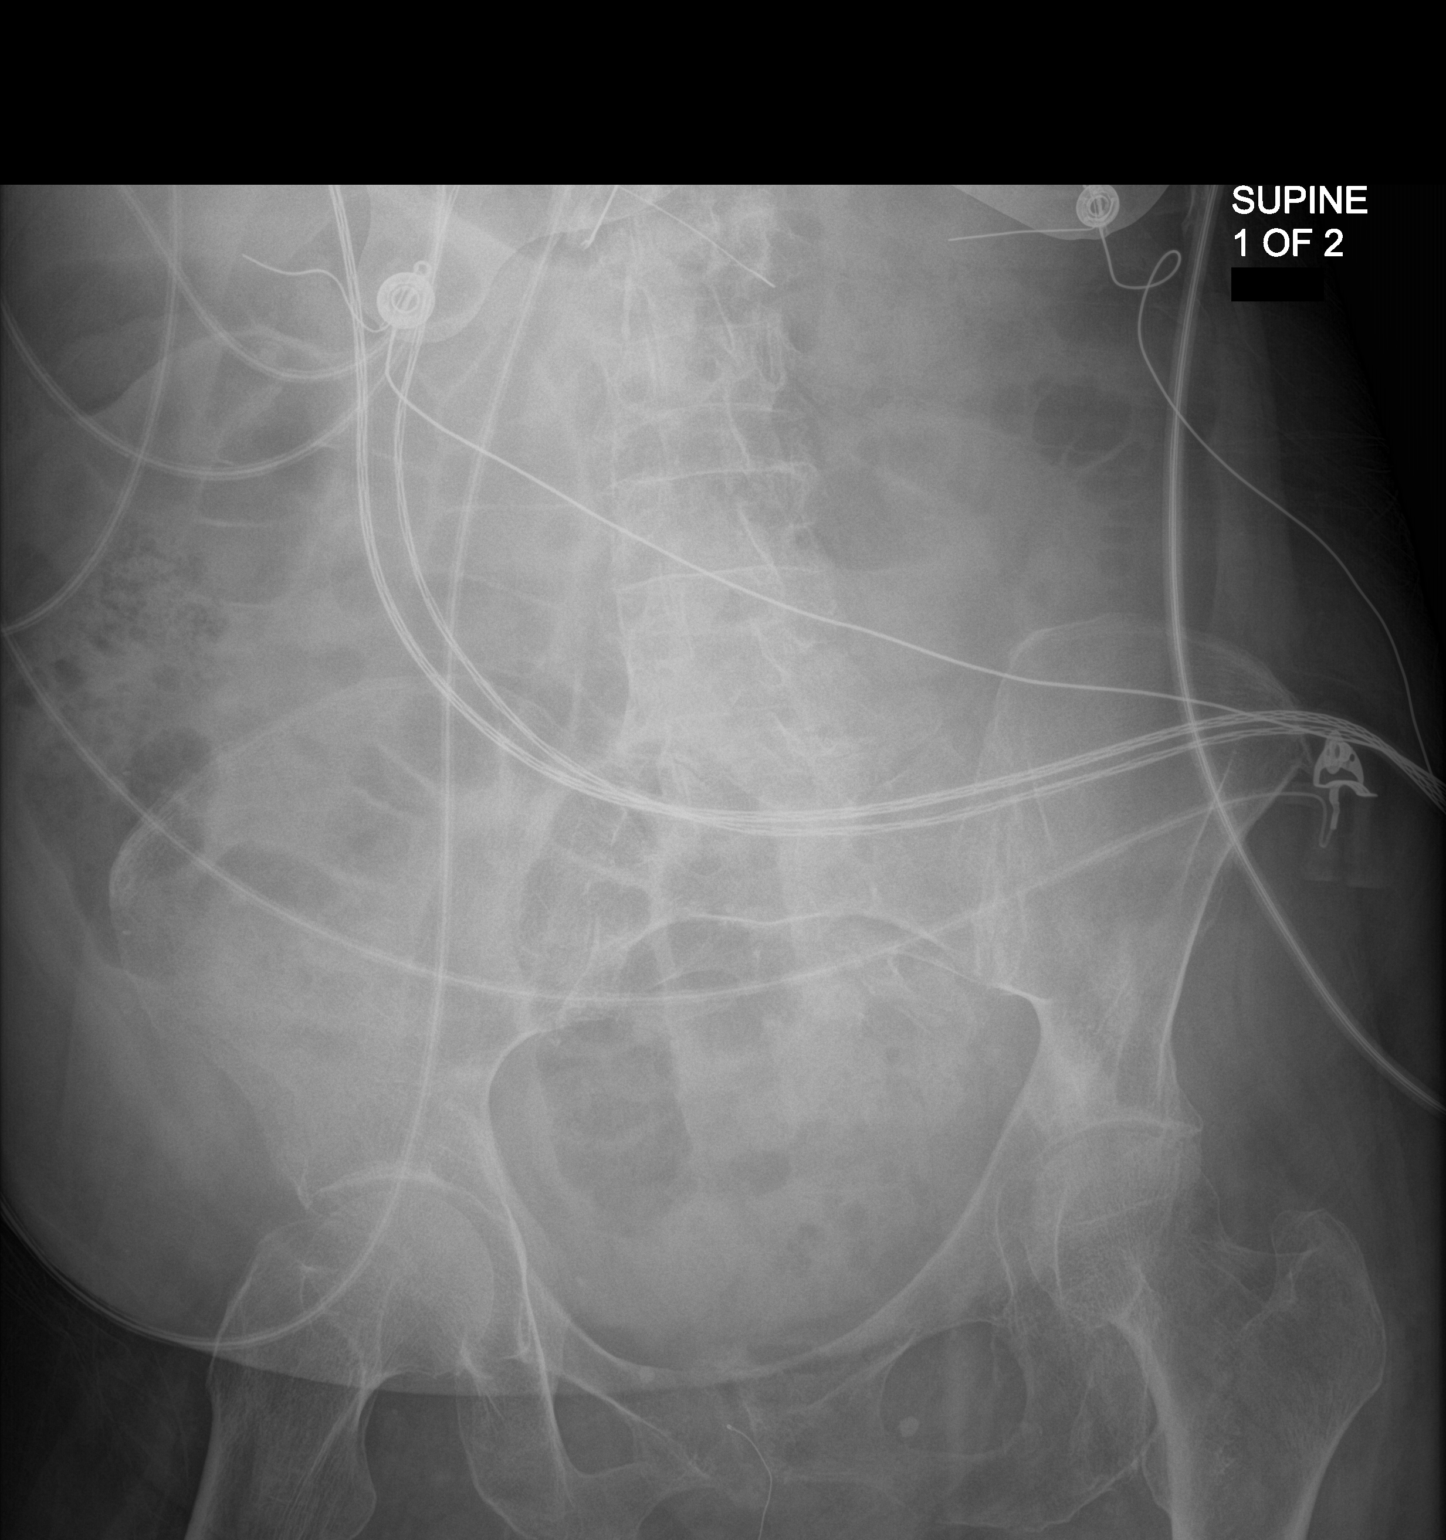
[im 2/2]
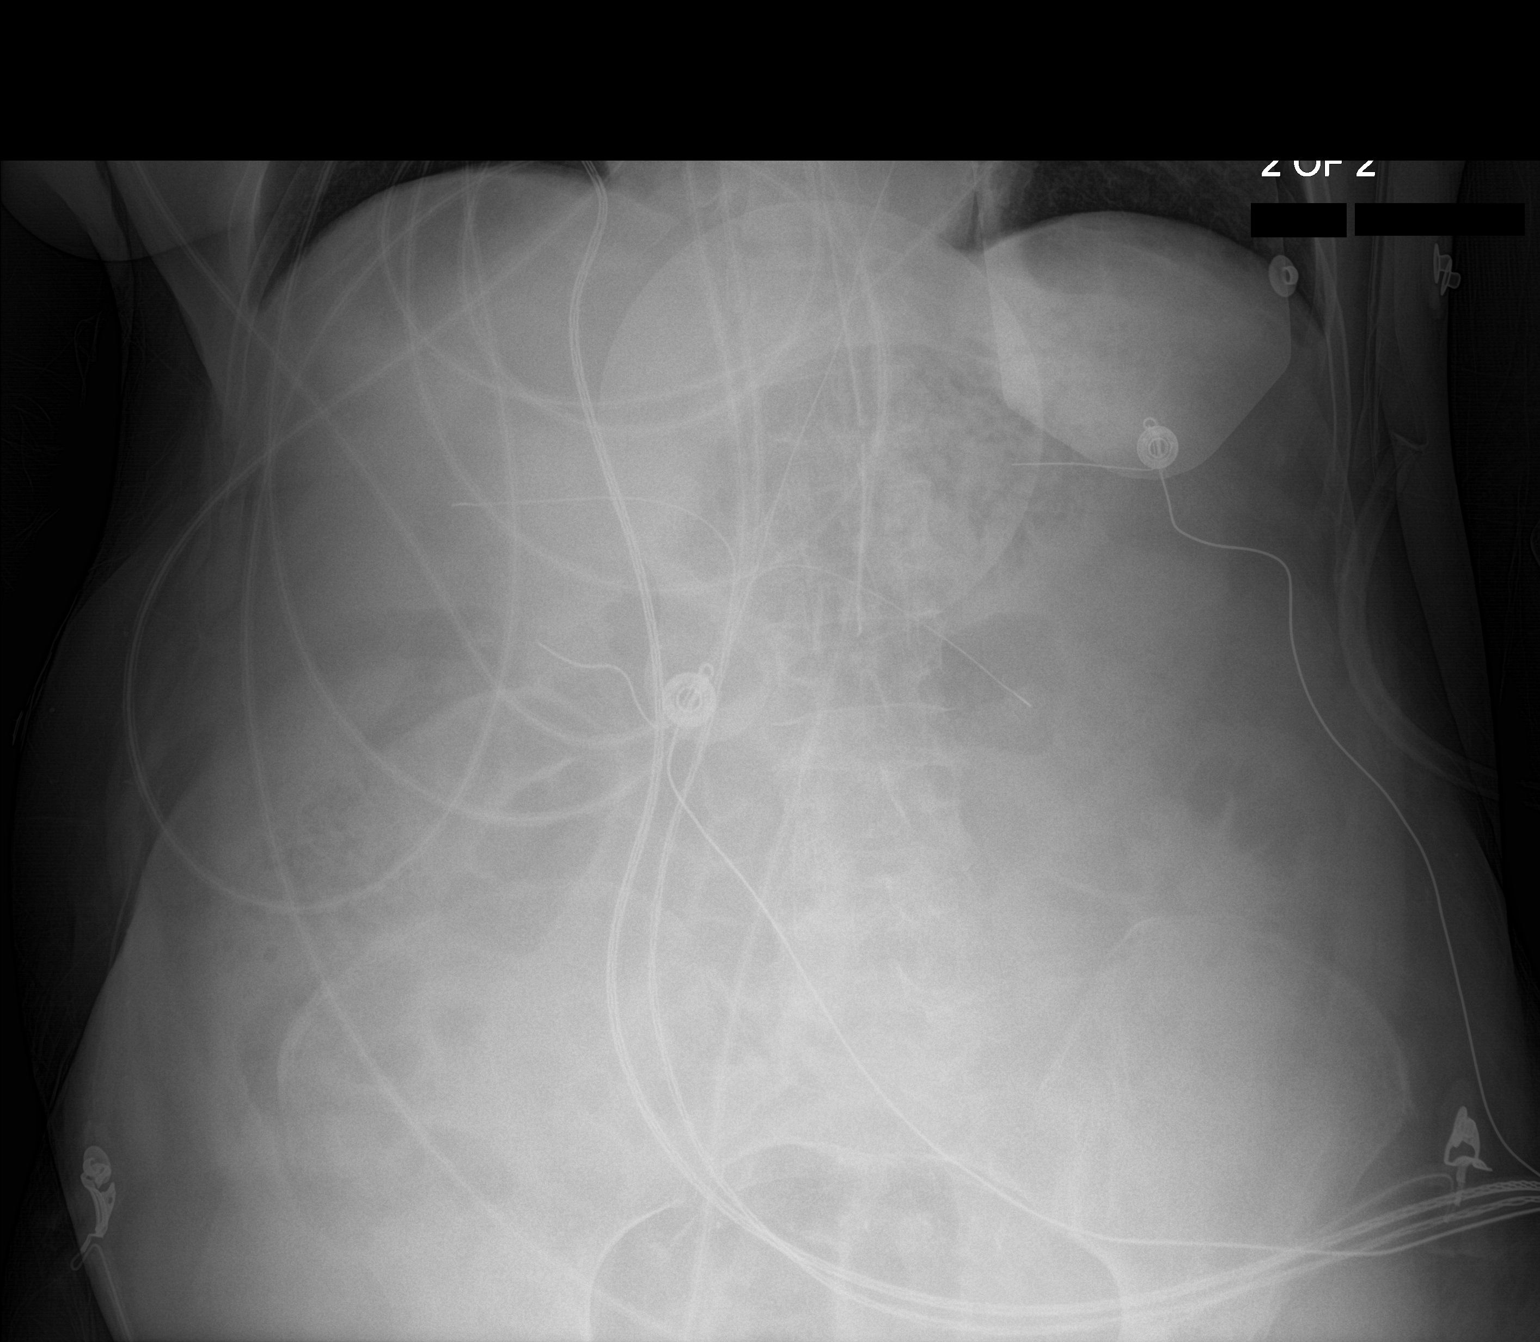

[2 of 2 positions shown; findings below may reference images not displayed]

FINDINGS: Persistent air-filled small bowel loops but slightly improved when
compared to prior study. There is some air and stool noted in the
right colon. No obvious free air.
IMPRESSION: Improved bowel gas pattern. Persistent air-filled loops of small
bowel but less so than prior study. There is also some air in stool
in the right colon.

## 2020-06-07 IMAGING — CT CT HEAD W/O CM
3 series · 15 of 47 positions shown, 18 images · non-contrast
Comparison: None.

CLINICAL DATA: Cardiac arrest.

EXAM:
CT HEAD WITHOUT CONTRAST
TECHNIQUE: Contiguous axial images were obtained from the base of the skull
through the vertex without intravenous contrast.

[Series 3: head wo · axial · 0.49mm/px · z∈[+416,+541]mm · 9 of 30 slices shown, 12 images]
[im 3/30  brain]
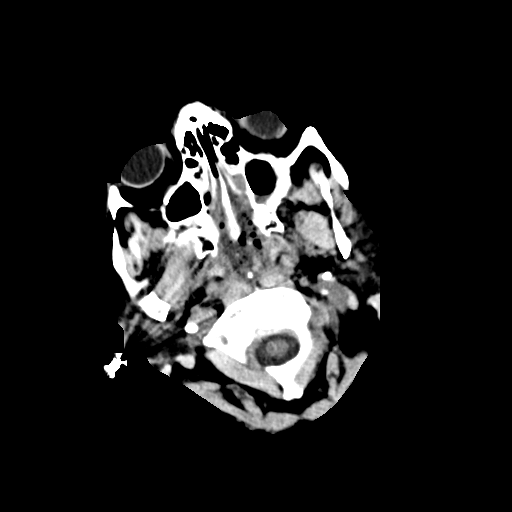
[im 3/30  bone]
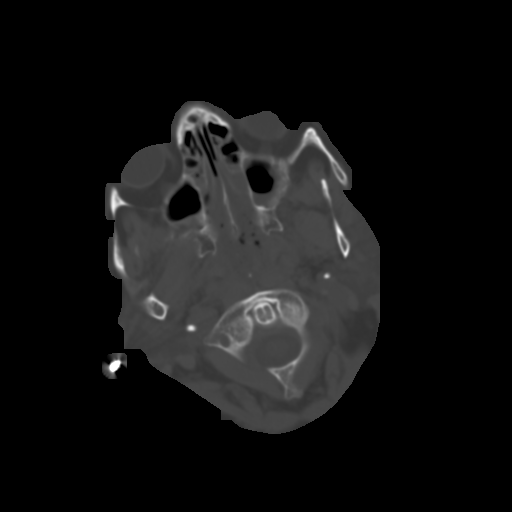
[im 6/30  brain]
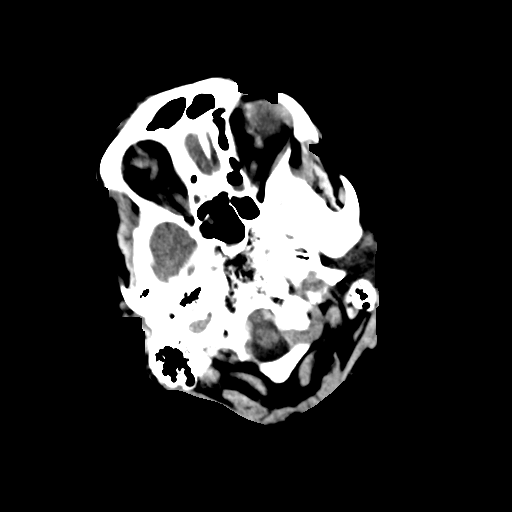
[im 9/30  brain]
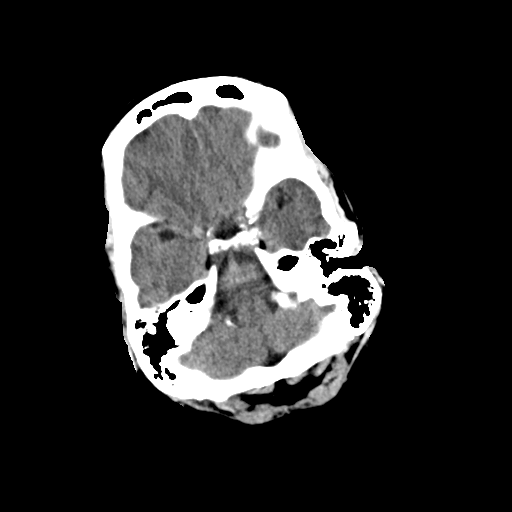
[im 12/30  brain]
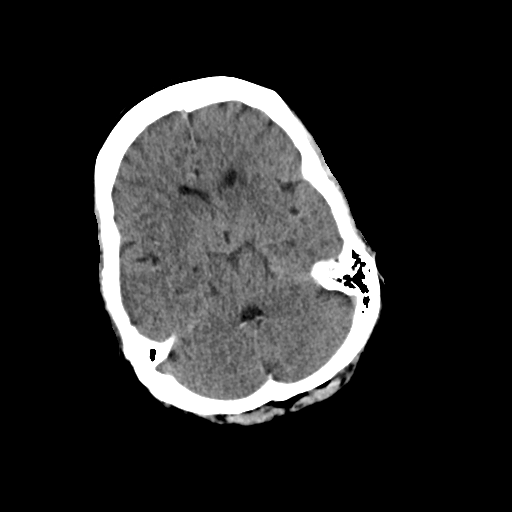
[im 16/30  brain]
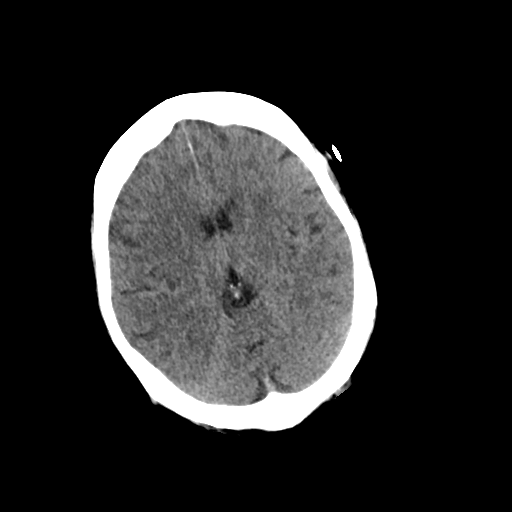
[im 16/30  bone]
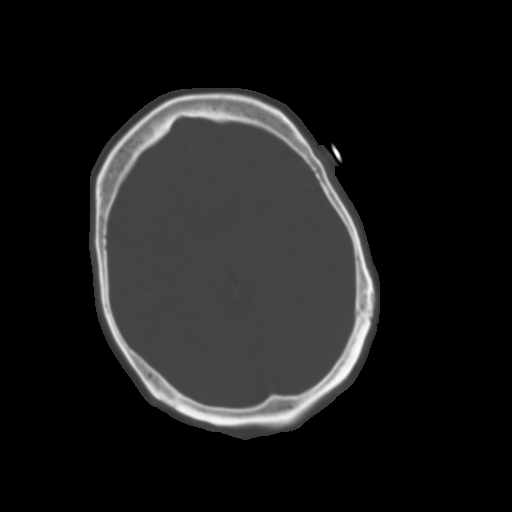
[im 19/30  brain]
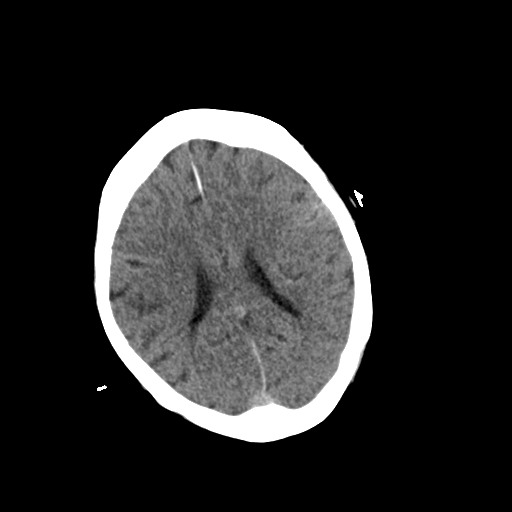
[im 22/30  brain]
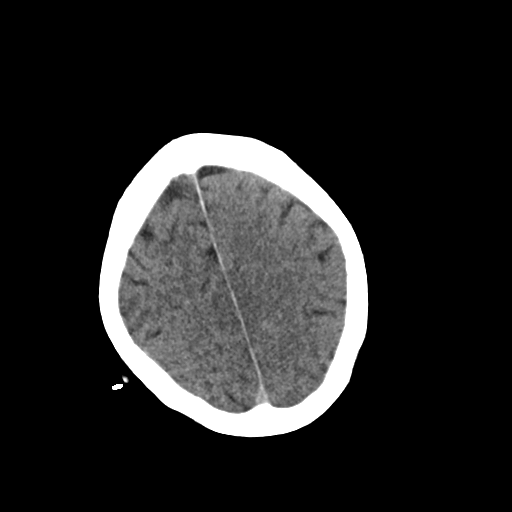
[im 25/30  brain]
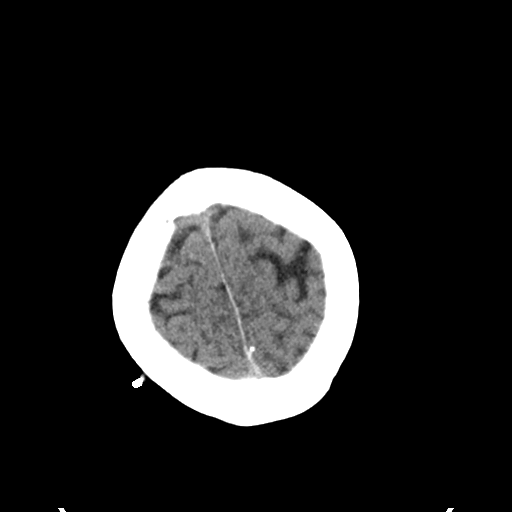
[im 28/30  brain]
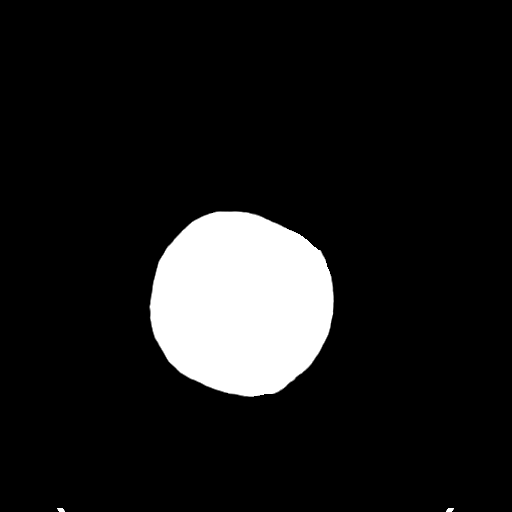
[im 28/30  bone]
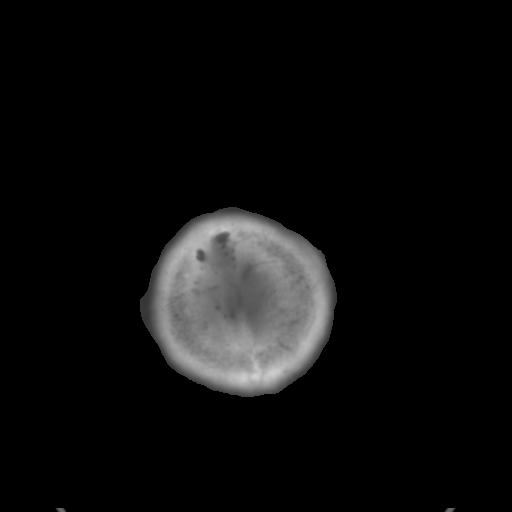

[Series 5: coronal soft tissue · coronal · 0.33mm/px · 3 of 68 slices shown]
[im 23/68  brain]
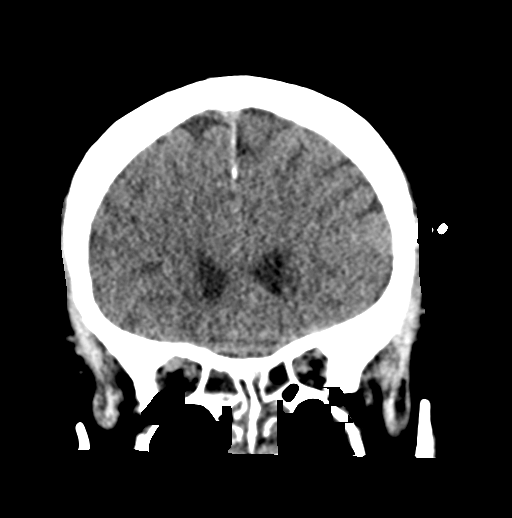
[im 30/68  brain]
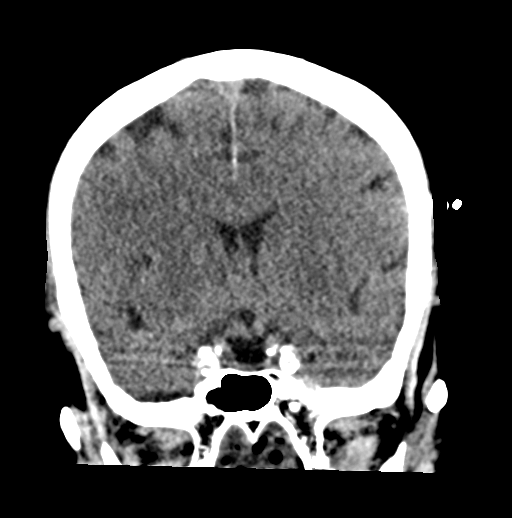
[im 38/68  brain]
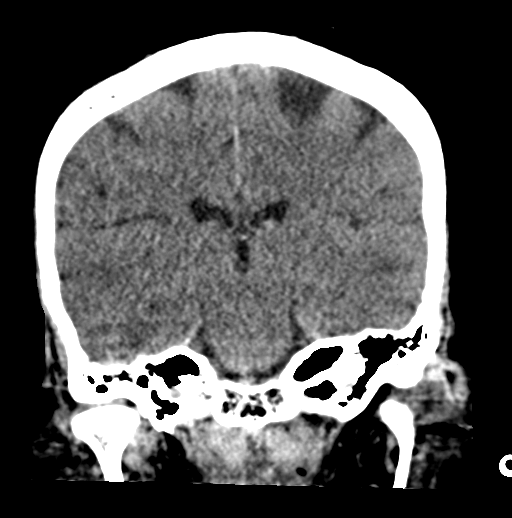

[Series 6: sagittal soft tissue · sagittal · 0.33mm/px · 3 of 55 slices shown]
[im 19/55  brain]
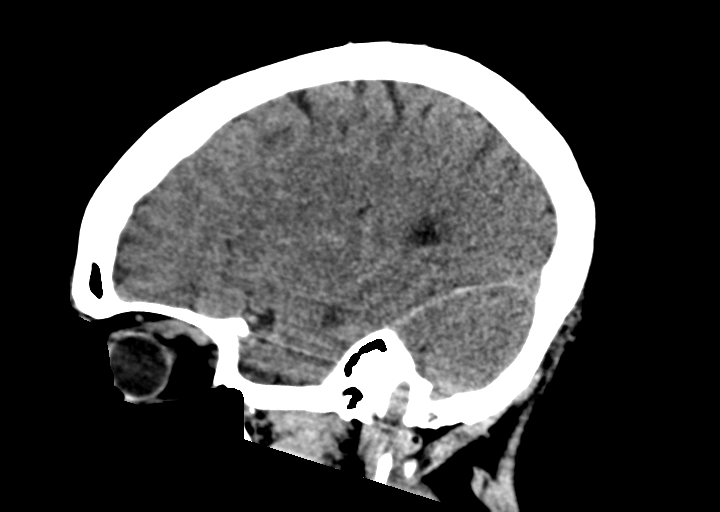
[im 28/55  brain]
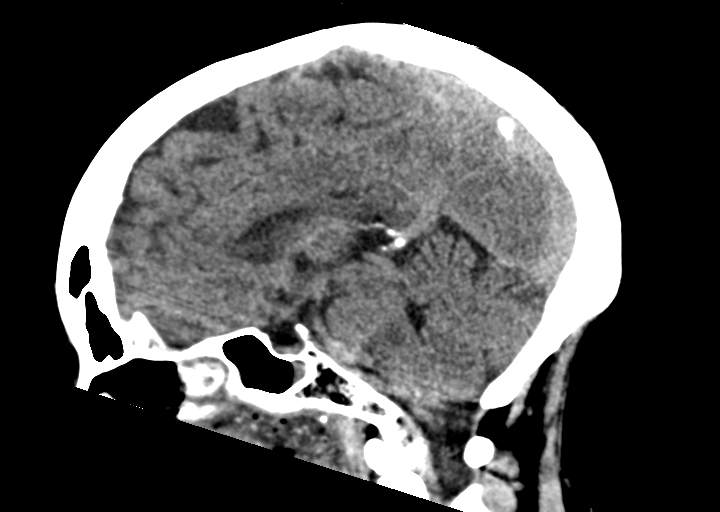
[im 37/55  brain]
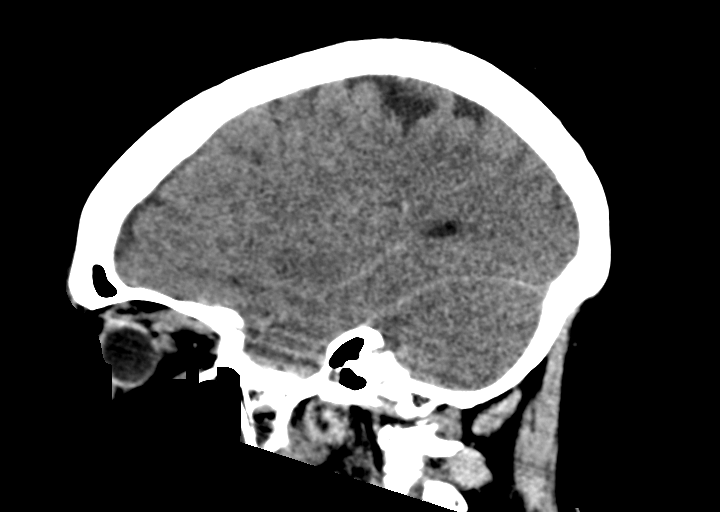

[15 of 47 positions shown; findings below may reference images not displayed]

FINDINGS: Brain: There is symmetric edema throughout the basal ganglia
bilaterally. There is decreased cortical gray-white differentiation
more conspicuous in the right than the left cerebral hemispheres.
There is no midline shift or tonsillar herniation. No intracranial
hemorrhage or extra-axial fluid collection is identified. The
ventricles are small.

Vascular: Calcified atherosclerosis at the skull base. No hyperdense
vessel.

Skull: No fracture or focal osseous lesion.

Sinuses/Orbits: Fluid in the paranasal sinuses, nasal cavity, and
pharynx likely related intubation. Clear mastoid air cells.
Bilateral cataract extraction.

Other: None.
IMPRESSION: 1. Cerebral edema consistent with anoxic injury.  No herniation.
2. No intracranial hemorrhage.
